# Patient Record
Sex: Female | Born: 1995
Health system: Southern US, Community
[De-identification: ages and names within clinical notes are randomized; demographics above are authoritative.]

## PROBLEM LIST (undated history)

## (undated) ENCOUNTER — Emergency Department (HOSPITAL_COMMUNITY)

## (undated) DIAGNOSIS — B019 Varicella without complication: Secondary | ICD-10-CM

## (undated) DIAGNOSIS — R51 Headache: Secondary | ICD-10-CM

## (undated) DIAGNOSIS — R519 Headache, unspecified: Secondary | ICD-10-CM

## (undated) DIAGNOSIS — T7840XA Allergy, unspecified, initial encounter: Secondary | ICD-10-CM

## (undated) DIAGNOSIS — K219 Gastro-esophageal reflux disease without esophagitis: Secondary | ICD-10-CM

## (undated) HISTORY — DX: Varicella without complication: B01.9

## (undated) HISTORY — DX: Headache, unspecified: R51.9

## (undated) HISTORY — DX: Headache: R51

## (undated) HISTORY — DX: Allergy, unspecified, initial encounter: T78.40XA

## (undated) HISTORY — DX: Gastro-esophageal reflux disease without esophagitis: K21.9

---
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
## 2013-01-08 ENCOUNTER — Emergency Department (HOSPITAL_BASED_OUTPATIENT_CLINIC_OR_DEPARTMENT_OTHER)
Admission: EM | Admit: 2013-01-08 | Discharge: 2013-01-08 | Disposition: A | Discharge status: Home / Self Care | Payer: 59 | Attending: Emergency Medicine | Admitting: Emergency Medicine

## 2013-01-08 ENCOUNTER — Encounter (HOSPITAL_BASED_OUTPATIENT_CLINIC_OR_DEPARTMENT_OTHER): Payer: Self-pay

## 2013-01-08 DIAGNOSIS — Y92410 Unspecified street and highway as the place of occurrence of the external cause: Secondary | ICD-10-CM | POA: Insufficient documentation

## 2013-01-08 DIAGNOSIS — Z8619 Personal history of other infectious and parasitic diseases: Secondary | ICD-10-CM | POA: Insufficient documentation

## 2013-01-08 DIAGNOSIS — S0990XA Unspecified injury of head, initial encounter: Secondary | ICD-10-CM | POA: Insufficient documentation

## 2013-01-08 DIAGNOSIS — Y9389 Activity, other specified: Secondary | ICD-10-CM | POA: Insufficient documentation

## 2013-01-08 MED ORDER — OXYCODONE-ACETAMINOPHEN 5-325 MG PO TABS: 2.0000 | ORAL_TABLET | ORAL | Status: DC | PRN

## 2013-01-08 MED ORDER — IBUPROFEN 600 MG PO TABS: 600.0000 mg | ORAL_TABLET | Freq: Four times a day (QID) | ORAL | Status: DC | PRN
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
## 2013-02-18 ENCOUNTER — Ambulatory Visit: Payer: 59 | Admitting: Family Medicine

## 2014-01-08 ENCOUNTER — Other Ambulatory Visit: Payer: Self-pay | Admitting: Obstetrics & Gynecology

## 2014-01-08 DIAGNOSIS — D242 Benign neoplasm of left breast: Secondary | ICD-10-CM

## 2014-01-16 ENCOUNTER — Other Ambulatory Visit: Payer: 59

## 2014-01-23 ENCOUNTER — Ambulatory Visit
Admission: RE | Admit: 2014-01-23 | Discharge: 2014-01-23 | Disposition: A | Discharge status: Home / Self Care | Payer: 59 | Source: Ambulatory Visit | Attending: Obstetrics & Gynecology | Admitting: Obstetrics & Gynecology

## 2014-01-23 DIAGNOSIS — D242 Benign neoplasm of left breast: Secondary | ICD-10-CM

## 2014-06-17 ENCOUNTER — Ambulatory Visit (INDEPENDENT_AMBULATORY_CARE_PROVIDER_SITE_OTHER): Payer: 59 | Admitting: Medical

## 2014-06-17 ENCOUNTER — Encounter: Payer: Self-pay | Admitting: Medical

## 2014-06-17 VITALS — BP 111/74 | HR 72 | Temp 98.3°F | Ht 65.5 in | Wt 133.4 lb

## 2014-06-17 DIAGNOSIS — R51 Headache: Secondary | ICD-10-CM

## 2014-06-17 DIAGNOSIS — J3089 Other allergic rhinitis: Secondary | ICD-10-CM

## 2014-06-17 DIAGNOSIS — J309 Allergic rhinitis, unspecified: Secondary | ICD-10-CM | POA: Insufficient documentation

## 2014-06-17 DIAGNOSIS — G44209 Tension-type headache, unspecified, not intractable: Secondary | ICD-10-CM

## 2014-06-17 DIAGNOSIS — R519 Headache, unspecified: Secondary | ICD-10-CM | POA: Insufficient documentation

## 2014-06-17 MED ORDER — SUMATRIPTAN SUCCINATE 50 MG PO TABS: 50.0000 mg | ORAL_TABLET | ORAL | Status: DC | PRN

## 2014-06-17 MED ORDER — CYCLOBENZAPRINE HCL 5 MG PO TABS: ORAL_TABLET | ORAL | Status: DC

## 2014-06-17 NOTE — Assessment & Plan Note (Signed)
Vs migraine. Pt is new pt and type is not yet clear. See below plan.  Your ha that you have had frequently may be tension vs migraine type(maybe mixture of both).  Continue alleve for mild ha. If you were to get ha with neck tension then recommend using flexeril. However, if you get HA with nausea, vomiting, sound sensitivity(as you sometimes get), or light sensitivity then take imitrex.  I want you to keep a ha log to understand frequency, intensity, features of ha and response to meds.  If your Ha severe and neurologic type signs or symptoms then ED evalution

## 2014-06-17 NOTE — Assessment & Plan Note (Signed)
Spring allergies mostly and meds otc due help a lot.

## 2014-06-17 NOTE — Patient Instructions (Signed)
Your ha that you have had frequently may be tension vs migraine type(maybe mixture of both).  Continue alleve for mild ha. If you were to get ha with neck tension then recommend using flexeril. However, if you get HA with nausea, vomiting, sound sensitivity(as you sometimes get), or light sensitivity then take imitrex.  I want you to keep a ha log to understand frequency, intensity, features of ha and response to meds.  If your Ha severe and neurologic type signs or symptoms then ED evalution  Follow up in 2 wks or as  Needed.
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
## 2014-07-01 ENCOUNTER — Ambulatory Visit: Payer: 59 | Admitting: Family Medicine

## 2014-07-08 ENCOUNTER — Telehealth: Payer: Self-pay | Admitting: Family Medicine

## 2014-07-08 ENCOUNTER — Ambulatory Visit: Payer: 59 | Admitting: Family Medicine

## 2014-07-08 ENCOUNTER — Encounter: Payer: Self-pay | Admitting: Family Medicine

## 2014-07-08 NOTE — Telephone Encounter (Signed)
Pt canceled appointment due to her feeling better. Pt stated she had a massage and since then she has not experienced any head aches.

## 2014-07-08 NOTE — Telephone Encounter (Signed)
Informed patient of this and appointment scheduled °

## 2014-07-08 NOTE — Telephone Encounter (Signed)
This was scheduled 1 week ago and confirmed yesterday.  Pt had time to call and cancel.  Please remind her that we would like at least 24 hr notice when cancelling.    Ok to remove from schedule.  No fee this time but there will be next time

## 2017-01-09 ENCOUNTER — Ambulatory Visit: Payer: 59 | Admitting: Physician Assistant

## 2017-04-25 LAB — OB RESULTS CONSOLE RPR: RPR: NONREACTIVE

## 2017-04-25 LAB — OB RESULTS CONSOLE HIV ANTIBODY (ROUTINE TESTING): HIV: NONREACTIVE

## 2017-04-25 LAB — OB RESULTS CONSOLE RUBELLA ANTIBODY, IGM: RUBELLA: IMMUNE

## 2017-04-25 LAB — OB RESULTS CONSOLE HEPATITIS B SURFACE ANTIGEN: HEP B S AG: NEGATIVE

## 2017-05-22 NOTE — L&D Delivery Note (Signed)
Delivery Note  First Stage: Labor onset: 7/13 @ 0500 Augmentation : pitocin Analgesia Lorriane Shire intrapartum: epidural SROM at 7/14 @ 1200  Second Stage: Complete dilation at 7/15 @ 2200 Onset of pushing at 2200 FHR second stage category 2  Delivery of a viable female at 2351 by CNM in LOA position no nuchal cord Cord double clamped after cessation of pulsation, cut by FOB Cord blood sample collected   Arterial cord blood sample collected / ph 7.16  Third Stage: Placenta delivered [facility] intact with 3 VC @ 2355 Placenta disposition: hospital disposal Uterine tone firm / bleeding small  no laceration identified  Est. Blood Loss (mL): 015  Complications: prolonged labor / presumed chorioamnionitis with maternal temp 101.5 at time of delivery - Amp & Gent given prior to delivery / compounded by some mild dehydration  Mom to postpartum.  Baby to Couplet care / Skin to Skin.  Newborn: Birth Weight: 7 pounds 5.3 oz Apgar Scores: 1-minute:                           5-minute:   Feeding planned: breastfeeding  Artelia Laroche CNM, MSN, [facility] 12/04/2017, 12:08 AM

## 2017-10-25 ENCOUNTER — Telehealth: Payer: Self-pay | Admitting: Emergency Medicine

## 2017-10-25 NOTE — Telephone Encounter (Signed)
Please advise 

## 2017-10-25 NOTE — Telephone Encounter (Signed)
I am happy to take her back as a pt.  CONGRATS on the pregnancy!  I will be out of the office July 22-August 5(ish) but otherwise I would be happy to see the baby!

## 2017-10-25 NOTE — Telephone Encounter (Signed)
Copied from [place] [phone]. Topic: General - Other >> Oct 25, 2017  3:55 PM Carolyn Stare wrote:  Person calling to ask if Dr Birdie Riddle will except her as a new pt. She had req to become a new pt some years ago but has some no shows and cancellations. She is having a baby that is due in late July and would like for Dr Birdie Riddle to to be the new borns doctor.   [phone]

## 2017-10-26 NOTE — Telephone Encounter (Signed)
LMOVM for pt to call back and schedule New Patient appt, ok per provider.

## 2017-11-15 LAB — OB RESULTS CONSOLE GBS: GBS: NEGATIVE

## 2017-11-15 LAB — OB RESULTS CONSOLE GC/CHLAMYDIA
Chlamydia: NEGATIVE
Gonorrhea: NEGATIVE

## 2017-11-15 NOTE — Telephone Encounter (Signed)
Patient scheduled with Dr. Birdie Riddle for 12/03/2017 at 2:15pm.

## 2017-12-02 ENCOUNTER — Other Ambulatory Visit: Payer: Self-pay

## 2017-12-02 ENCOUNTER — Inpatient Hospital Stay (HOSPITAL_COMMUNITY)
Admission: AD | Admit: 2017-12-02 | Discharge: 2017-12-02 | Disposition: A | Discharge status: Home / Self Care | Payer: 59 | Source: Ambulatory Visit

## 2017-12-02 ENCOUNTER — Encounter (HOSPITAL_COMMUNITY): Payer: Self-pay

## 2017-12-02 DIAGNOSIS — Z3A38 38 weeks gestation of pregnancy: Secondary | ICD-10-CM | POA: Insufficient documentation

## 2017-12-02 MED ORDER — LACTATED RINGERS IV SOLN
Freq: Once | INTRAVENOUS | Status: AC
  Administered 2017-12-02: 05:00:00 via INTRAVENOUS

## 2017-12-02 MED ORDER — MORPHINE SULFATE (PF) 4 MG/ML IV SOLN
4.0000 mg | Freq: Once | INTRAVENOUS | Status: AC
Start: 2017-12-02 — End: 2017-12-02
  Administered 2017-12-02: 4 mg via INTRAMUSCULAR
  Filled 2017-12-02: qty 1

## 2017-12-02 MED ORDER — PROMETHAZINE HCL 25 MG/ML IJ SOLN
25.0000 mg | Freq: Once | INTRAMUSCULAR | Status: AC
  Administered 2017-12-02: 25 mg via INTRAMUSCULAR
  Filled 2017-12-02: qty 1

## 2017-12-02 MED ORDER — MORPHINE SULFATE (PF) 4 MG/ML IV SOLN
4.0000 mg | Freq: Once | INTRAVENOUS | Status: AC
  Administered 2017-12-02: 4 mg via INTRAVENOUS
  Filled 2017-12-02: qty 1

## 2017-12-02 NOTE — MAU Provider Note (Signed)
Brenda Freeman is a 22 y.o. G1P0 at [redacted]w[redacted]d presenting for labor check.  Pt notes painful contractions for past 24 hour. Good fetal movement, minimal pink/red dc with wiping, no leaking fluid.  Desires waterbirth and using [place] for labor coping. [name] spouse is present and supportive.  Feels exhausted, has been up since last night and cannot rest, has tried hydrotherapy at home with minimal relief.  Prenatal Course Source of Care:[name]  with onset of care in 1st trim Pregnancy complications or risk: excessive weight gain 44 lbs Current medications:  PNV's  OB History    Gravida  1   Para      Term      Preterm      AB      Living        SAB      TAB      Ectopic      Multiple      Live Births             Past Medical History:  Diagnosis Date  . Allergy   . Chicken pox   . Frequent headaches    History reviewed. No pertinent surgical history. Family History: family history is not on file. Social History:  reports that she has never smoked. She has never used smokeless tobacco. She reports that she drinks alcohol. She reports that she does not use drugs.  Review of Systems - Negative except painful ctx  Blood pressure 127/80, pulse 94, temperature 98.4 F (36.9 C), temperature source Oral, resp. rate 18, height 5\' 4"  (1.626 m), weight 75.3 kg (166 lb 0.1 oz).  Physical Exam:  General: NAD, teary w/ ctx Heart: RRR, no murmurs Lungs: CTA b/l  Abd: Soft, NT, EFW 6.5-7 lbs  Ext: no edema Neuro: DTRs normal  Membranes:intact Vaginal bleeding:none  Digital Cervical Exam: Dilatation 2-3   Effacement 90  Station -1  Presentation vertex  FHR:  Baseline rate 140   Variability moderate  Accelerations present  Decelerations small variables occasional Contractions: Frequency irregular 1-5  Duration 30-80  Intensity mild  Pertinent Labs/Studies:  Rh pos, PNLabs grossly wnl, GBS neg  Prenatal labs: ABO, Rh:  O pos Antibody:  neg Rubella:   imm RPR:   NR HBsAg:   NR HIV:   Neg GBS:   Neg 1 hr Glucola 91  Genetic screening negative Ultrasound: Weeks 18 Result norm anatomy, 28 wks sono EFW 67%, AFI wnl, anatomy completed  Assessment: 22 y.o. G1P0 at [redacted]w[redacted]d  1. Labor: latent, protracted 2. Fetal Wellbeing: Category 1  3. Pain Control: maternal fatigue, desires help 4. GBS: neg   Plan:  1. Options for continued expectant management, therapeutic rest reviewed Patient desires IV pain meds and hydration with possible DC home if pain relief adequate.  Morphine 4mg  IM and 4 Mg IV. Phenergan 25 mg IM. LR 1L bolus now.   May DC home if [facility] cat 1 and patient stable.  Active labor precautions    Juliene Pina, CNM, MSN 12/02/2017, 4:37 AM

## 2017-12-02 NOTE — MAU Note (Signed)
Urine sent to lab 

## 2017-12-02 NOTE — MAU Note (Signed)
I have communicated with Eddie Dibbles, CNM and reviewed vital signs:  Vitals:   12/02/17 0409 12/02/17 0622  BP: 127/80 125/77  Pulse: 94 94  Resp: 18 18  Temp: 98.4 F (36.9 C)     Vaginal exam:  Dilation: 3 Effacement (%): 90 Station: -1 Presentation: Vertex Exam by:: [employer], RN,   Also reviewed contraction pattern and that non-stress test is reactive.  It has been documented that patient is contracting every 5-7 minutes with no cervical change over 1 hour not indicating active labor.  Patient denies any other complaints.  Based on this report provider has given order for discharge.  A discharge order and diagnosis entered by a provider.   Labor discharge instructions reviewed with patient.

## 2017-12-02 NOTE — Discharge Instructions (Signed)
Braxton Hicks Contractions °Contractions of the uterus can occur throughout pregnancy, but they are not always a sign that you are in labor. You may have practice contractions called Braxton Hicks contractions. These false labor contractions are sometimes confused with true labor. °What are Braxton Hicks contractions? °Braxton Hicks contractions are tightening movements that occur in the muscles of the uterus before labor. Unlike true labor contractions, these contractions do not result in opening (dilation) and thinning of the cervix. Toward the end of pregnancy (32-34 weeks), Braxton Hicks contractions can happen more often and may become stronger. These contractions are sometimes difficult to tell apart from true labor because they can be very uncomfortable. You should not feel embarrassed if you go to the hospital with false labor. °Sometimes, the only way to tell if you are in true labor is for your health care provider to look for changes in the cervix. The health care provider will do a physical exam and may monitor your contractions. If you are not in true labor, the exam should show that your cervix is not dilating and your water has not broken. °If there are other health problems associated with your pregnancy, it is completely safe for you to be sent home with false labor. You may continue to have Braxton Hicks contractions until you go into true labor. °How to tell the difference between true labor and false labor °True labor °· Contractions last 30-70 seconds. °· Contractions become very regular. °· Discomfort is usually felt in the top of the uterus, and it spreads to the lower abdomen and low back. °· Contractions do not go away with walking. °· Contractions usually become more intense and increase in frequency. °· The cervix dilates and gets thinner. °False labor °· Contractions are usually shorter and not as strong as true labor contractions. °· Contractions are usually irregular. °· Contractions  are often felt in the front of the lower abdomen and in the groin. °· Contractions may go away when you walk around or change positions while lying down. °· Contractions get weaker and are shorter-lasting as time goes on. °· The cervix usually does not dilate or become thin. °Follow these instructions at home: °· Take over-the-counter and prescription medicines only as told by your health care provider. °· Keep up with your usual exercises and follow other instructions from your health care provider. °· Eat and drink lightly if you think you are going into labor. °· If Braxton Hicks contractions are making you uncomfortable: °? Change your position from lying down or resting to walking, or change from walking to resting. °? Sit and rest in a tub of warm water. °? Drink enough fluid to keep your urine pale yellow. Dehydration may cause these contractions. °? Do slow and deep breathing several times an hour. °· Keep all follow-up prenatal visits as told by your health care provider. This is important. °Contact a health care provider if: °· You have a fever. °· You have continuous pain in your abdomen. °Get help right away if: °· Your contractions become stronger, more regular, and closer together. °· You have fluid leaking or gushing from your vagina. °· You pass blood-tinged mucus (bloody show). °· You have bleeding from your vagina. °· You have low back pain that you never had before. °· You feel your baby’s head pushing down and causing pelvic pressure. °· Your baby is not moving inside you as much as it used to. °Summary °· Contractions that occur before labor are called Braxton   Hicks contractions, false labor, or practice contractions. °· Braxton Hicks contractions are usually shorter, weaker, farther apart, and less regular than true labor contractions. True labor contractions usually become progressively stronger and regular and they become more frequent. °· Manage discomfort from Braxton Hicks contractions by  changing position, resting in a warm bath, drinking plenty of water, or practicing deep breathing. °This information is not intended to replace advice given to you by your health care provider. Make sure you discuss any questions you have with your health care provider. °Document Released: 09/21/2016 Document Revised: 09/21/2016 Document Reviewed: 09/21/2016 °Elsevier Interactive Patient Education © 2018 [employer] ° °

## 2017-12-02 NOTE — MAU Note (Signed)
Pt. States ctx are 5 min apart and that she was told to come in by midwife. Denies leaking. States bleeding when she wipes.

## 2017-12-03 ENCOUNTER — Inpatient Hospital Stay (HOSPITAL_COMMUNITY): Payer: 59 | Admitting: Anesthesiology

## 2017-12-03 ENCOUNTER — Encounter (HOSPITAL_COMMUNITY): Payer: Self-pay | Admitting: *Deleted

## 2017-12-03 ENCOUNTER — Other Ambulatory Visit: Payer: Self-pay

## 2017-12-03 ENCOUNTER — Ambulatory Visit: Payer: 59 | Admitting: Family Medicine

## 2017-12-03 ENCOUNTER — Inpatient Hospital Stay (HOSPITAL_COMMUNITY)
Admission: AD | Admit: 2017-12-03 | Discharge: 2017-12-05 | DRG: 805 | Disposition: A | Discharge status: Home / Self Care | Payer: 59 | Source: Ambulatory Visit | Attending: Certified Nurse Midwife | Admitting: Certified Nurse Midwife

## 2017-12-03 DIAGNOSIS — O411230 Chorioamnionitis, third trimester, not applicable or unspecified: Secondary | ICD-10-CM | POA: Diagnosis present

## 2017-12-03 DIAGNOSIS — O411290 Chorioamnionitis, unspecified trimester, not applicable or unspecified: Secondary | ICD-10-CM | POA: Diagnosis not present

## 2017-12-03 DIAGNOSIS — E860 Dehydration: Secondary | ICD-10-CM | POA: Diagnosis present

## 2017-12-03 DIAGNOSIS — O9962 Diseases of the digestive system complicating childbirth: Secondary | ICD-10-CM | POA: Diagnosis present

## 2017-12-03 DIAGNOSIS — K219 Gastro-esophageal reflux disease without esophagitis: Secondary | ICD-10-CM | POA: Diagnosis present

## 2017-12-03 DIAGNOSIS — O99284 Endocrine, nutritional and metabolic diseases complicating childbirth: Secondary | ICD-10-CM | POA: Diagnosis present

## 2017-12-03 DIAGNOSIS — O2603 Excessive weight gain in pregnancy, third trimester: Secondary | ICD-10-CM | POA: Diagnosis present

## 2017-12-03 DIAGNOSIS — O9902 Anemia complicating childbirth: Secondary | ICD-10-CM | POA: Diagnosis present

## 2017-12-03 DIAGNOSIS — D509 Iron deficiency anemia, unspecified: Secondary | ICD-10-CM | POA: Diagnosis present

## 2017-12-03 DIAGNOSIS — Z3A38 38 weeks gestation of pregnancy: Secondary | ICD-10-CM | POA: Diagnosis not present

## 2017-12-03 LAB — TYPE AND SCREEN
ABO/RH(D): O POS
ANTIBODY SCREEN: NEGATIVE

## 2017-12-03 LAB — CBC
HCT: 31.4 % — ABNORMAL LOW (ref 36.0–46.0)
Hemoglobin: 10.3 g/dL — ABNORMAL LOW (ref 12.0–15.0)
MCH: 25.8 pg — ABNORMAL LOW (ref 26.0–34.0)
MCHC: 32.8 g/dL (ref 30.0–36.0)
MCV: 78.7 fL (ref 78.0–100.0)
Platelets: 247 10*3/uL (ref 150–400)
RBC: 3.99 MIL/uL (ref 3.87–5.11)
RDW: 14.5 % (ref 11.5–15.5)
WBC: 13.5 10*3/uL — AB (ref 4.0–10.5)

## 2017-12-03 LAB — RPR: RPR: NONREACTIVE

## 2017-12-03 LAB — POCT FERN TEST: POCT Fern Test: POSITIVE

## 2017-12-03 LAB — ABO/RH: ABO/RH(D): O POS

## 2017-12-03 MED ORDER — SOD CITRATE-CITRIC ACID 500-334 MG/5ML PO SOLN
30.0000 mL | ORAL | Status: DC | PRN
  Administered 2017-12-03: 30 mL via ORAL
  Filled 2017-12-03: qty 15

## 2017-12-03 MED ORDER — OXYTOCIN BOLUS FROM INFUSION
500.0000 mL | Freq: Once | INTRAVENOUS | Status: AC
  Administered 2017-12-03: 500 mL via INTRAVENOUS

## 2017-12-03 MED ORDER — PHENYLEPHRINE 40 MCG/ML (10ML) SYRINGE FOR IV PUSH (FOR BLOOD PRESSURE SUPPORT)
80.0000 ug | PREFILLED_SYRINGE | INTRAVENOUS | Status: DC | PRN
  Filled 2017-12-03: qty 5

## 2017-12-03 MED ORDER — PHENYLEPHRINE 40 MCG/ML (10ML) SYRINGE FOR IV PUSH (FOR BLOOD PRESSURE SUPPORT)
80.0000 ug | PREFILLED_SYRINGE | INTRAVENOUS | Status: DC | PRN
  Filled 2017-12-03: qty 10
  Filled 2017-12-03: qty 5

## 2017-12-03 MED ORDER — EPHEDRINE 5 MG/ML INJ
10.0000 mg | INTRAVENOUS | Status: DC | PRN
  Filled 2017-12-03: qty 2

## 2017-12-03 MED ORDER — ONDANSETRON HCL 4 MG/2ML IJ SOLN: 4.0000 mg | Freq: Four times a day (QID) | INTRAMUSCULAR | Status: DC | PRN

## 2017-12-03 MED ORDER — GENTAMICIN SULFATE 40 MG/ML IJ SOLN
160.0000 mg | Freq: Three times a day (TID) | INTRAVENOUS | Status: AC
  Administered 2017-12-04 – 2017-12-05 (×4): 160 mg via INTRAVENOUS
  Filled 2017-12-03 (×4): qty 4

## 2017-12-03 MED ORDER — SODIUM CHLORIDE 0.9 % IV SOLN
2.0000 g | Freq: Four times a day (QID) | INTRAVENOUS | Status: DC
  Administered 2017-12-03 – 2017-12-04 (×2): 2 g via INTRAVENOUS
  Filled 2017-12-03: qty 2000
  Filled 2017-12-03 (×2): qty 2

## 2017-12-03 MED ORDER — FLEET ENEMA 7-19 GM/118ML RE ENEM: 1.0000 | ENEMA | RECTAL | Status: DC | PRN

## 2017-12-03 MED ORDER — PROMETHAZINE HCL 25 MG/ML IJ SOLN
12.5000 mg | Freq: Once | INTRAMUSCULAR | Status: AC
  Administered 2017-12-03: 12.5 mg via INTRAVENOUS
  Filled 2017-12-03: qty 1

## 2017-12-03 MED ORDER — OXYCODONE-ACETAMINOPHEN 5-325 MG PO TABS: 1.0000 | ORAL_TABLET | ORAL | Status: DC | PRN

## 2017-12-03 MED ORDER — LACTATED RINGERS IV SOLN
INTRAVENOUS | Status: DC
  Administered 2017-12-03 (×3): via INTRAVENOUS

## 2017-12-03 MED ORDER — OXYCODONE-ACETAMINOPHEN 5-325 MG PO TABS: 2.0000 | ORAL_TABLET | ORAL | Status: DC | PRN

## 2017-12-03 MED ORDER — OXYTOCIN 40 UNITS IN LACTATED RINGERS INFUSION - SIMPLE MED
1.0000 m[IU]/min | INTRAVENOUS | Status: DC
  Administered 2017-12-03: 2 m[IU]/min via INTRAVENOUS
  Filled 2017-12-03: qty 1000

## 2017-12-03 MED ORDER — FENTANYL 2.5 MCG/ML BUPIVACAINE 1/10 % EPIDURAL INFUSION (WH - ANES)
14.0000 mL/h | INTRAMUSCULAR | Status: DC | PRN
  Administered 2017-12-03 (×3): 14 mL/h via EPIDURAL
  Filled 2017-12-03 (×3): qty 100

## 2017-12-03 MED ORDER — TERBUTALINE SULFATE 1 MG/ML IJ SOLN
0.2500 mg | Freq: Once | INTRAMUSCULAR | Status: DC | PRN
  Filled 2017-12-03: qty 1

## 2017-12-03 MED ORDER — LIDOCAINE HCL (PF) 1 % IJ SOLN
INTRAMUSCULAR | Status: DC | PRN
  Administered 2017-12-03: 8 mL via EPIDURAL

## 2017-12-03 MED ORDER — DIPHENHYDRAMINE HCL 50 MG/ML IJ SOLN: 12.5000 mg | INTRAMUSCULAR | Status: DC | PRN

## 2017-12-03 MED ORDER — LIDOCAINE HCL (PF) 1 % IJ SOLN
30.0000 mL | INTRAMUSCULAR | Status: DC | PRN
  Filled 2017-12-03: qty 30

## 2017-12-03 MED ORDER — LACTATED RINGERS IV SOLN
500.0000 mL | Freq: Once | INTRAVENOUS | Status: AC
  Administered 2017-12-03: 500 mL via INTRAVENOUS

## 2017-12-03 MED ORDER — FENTANYL CITRATE (PF) 100 MCG/2ML IJ SOLN
50.0000 ug | Freq: Once | INTRAMUSCULAR | Status: AC
  Administered 2017-12-03: 50 ug via INTRAVENOUS
  Filled 2017-12-03: qty 2

## 2017-12-03 MED ORDER — OXYTOCIN 40 UNITS IN LACTATED RINGERS INFUSION - SIMPLE MED: 2.5000 [IU]/h | INTRAVENOUS | Status: DC

## 2017-12-03 MED ORDER — LACTATED RINGERS IV SOLN
500.0000 mL | INTRAVENOUS | Status: DC | PRN
  Administered 2017-12-03: 300 mL via INTRAVENOUS
  Administered 2017-12-03 (×2): 500 mL via INTRAVENOUS
  Administered 2017-12-03: 1000 mL via INTRAVENOUS

## 2017-12-03 MED ORDER — ACETAMINOPHEN 325 MG PO TABS
650.0000 mg | ORAL_TABLET | ORAL | Status: DC | PRN
  Administered 2017-12-03: 650 mg via ORAL
  Filled 2017-12-03: qty 2

## 2017-12-03 NOTE — Anesthesia Pain Management Evaluation Note (Signed)
  CRNA Pain Management Visit Note  Patient: Brenda Freeman, 22 y.o., female  "Hello I am a member of the anesthesia team at [facility]. We have an anesthesia team available at all times to provide care throughout the hospital, including epidural management and anesthesia for C-section. I don't know your plan for the delivery whether it a natural birth, water birth, IV sedation, nitrous supplementation, doula or epidural, but we want to meet your pain goals."   1.Was your pain managed to your expectations on prior hospitalizations?   No prior hospitalizations  2.What is your expectation for pain management during this hospitalization?     Epidural  3.How can we help you reach that goal? support  Record the patient's initial score and the patient's pain goal.   Pain: 2  Pain Goal: 4 [facility] wants you to be able to say your pain was always managed very well.  Sandrea Matte 12/03/2017

## 2017-12-03 NOTE — H&P (Signed)
OB ADMISSION/ HISTORY & PHYSICAL:  Admission Date: 12/03/2017  3:36 AM  Admit Diagnosis: 23 WEEKS CTX, SROM  Brenda Freeman is a 22 y.o. female presenting for latent labor x 48 hrs and now reporting LOF since last evening at 1800. Reports clear AF. Small amount bloody show.  Was seen in MAU yesterday and received therapeutic rest with Morphine and Phenergan which helped her sleep for several hours. Now reporting ctx very painful and has not been able to sleep this night.  + FM, NO HA/RUQ pain or visual changes. + Nausea. Spouse [name] and doula [name] here and giving labor support.  Patient desires epidural for pain relief, but due to unit acuity is waiting for a room in L&D.   Prenatal History: G1P0   EDC : 12/12/2017,  Source of Care:[name]  with onset of care in 1st trim Pregnancy complications or risk: excessive weight gain 44 lbs Current medications:   PNV's  Prenatal labs: ABO, Rh:  O pos Antibody:  neg Rubella:  imm RPR:   NR HBsAg:   NR HIV:   Neg GBS:   Neg 1 hr Glucola 91            Genetic screening negative Ultrasound: Weeks 18 Result norm anatomy, 28 wks sono EFW 67%, AFI wnl, anatomy completed   Medical / Surgical History :  Past medical history:  Past Medical History:  Diagnosis Date  . Allergy   . Chicken pox   . Frequent headaches      Past surgical history: History reviewed. No pertinent surgical history.   Family History: No family history on file.   Social History:  reports that she has never smoked. She has never used smokeless tobacco. She reports that she drinks alcohol. She reports that she does not use drugs.   Allergies: Patient has no known allergies.   Current Medications at time of admission:  Medications Prior to Admission  Medication Sig Dispense Refill Last Dose  . acetaminophen (TYLENOL) 325 MG suppository Place 325 mg rectally every 4 (four) hours as needed.   12/02/2017 at Unknown time  . cyclobenzaprine (FLEXERIL) 5 MG  tablet 1 tab po tid prn neck pain 10 tablet 0 More than a month at Unknown time  . ibuprofen (ADVIL,MOTRIN) 600 MG tablet Take 1 tablet (600 mg total) by mouth every 6 (six) hours as needed for pain. 30 tablet 0 More than a month at Unknown time  . Prenatal Vit-Fe Fumarate-FA (MULTIVITAMIN-PRENATAL) 27-0.8 MG TABS tablet Take 1 tablet by mouth daily at 12 noon.   Past Week at Unknown time  . SUMAtriptan (IMITREX) 50 MG tablet Take 1 tablet (50 mg total) by mouth every 2 (two) hours as needed for migraine or headache. May repeat in 2 hours if headache persists or recurs. 10 tablet 0 More than a month at Unknown time     Review of Systems: ROS As noted above  Physical Exam: Vital signs and nursing notes reviewed.  ED Triage Vitals  Enc Vitals Group     BP 12/03/17 0414 122/74     Pulse Rate 12/03/17 0414 99     Resp 12/03/17 0414 18     Temp 12/03/17 0414 98.4 F (36.9 C)     Temp src --      SpO2 12/03/17 0414 99 %     Weight 12/03/17 0358 166 lb (75.3 kg)    General: AAO x 3, NAD, coping w/ ctx but very tired Heart: RRR Lungs:CTAB Abdomen:  Gravid, NT, Leopold's vertex, EFW 6.5-7 lbs Extremities: no edema Genitalia / VE: Dilation: 3.5 Effacement (%): 90 Cervical Position: Middle Station: -2 Presentation: Vertex Exam by:: B Mosca RN   FHR:  145 BPM, min/mod variability, + accels, occasional early decels TOCO: Ctx q2-6 min, irregular, palp mild  Labs:   Pending T&S, RPR  Recent Labs    12/03/17 0521  WBC 13.5*  HGB 10.3*  HCT 31.4*  PLT 247     Assessment:  22 y.o. G1P0 at [redacted]w[redacted]d  1. Prolonged latent stage of labor 2. FHR category 1 3. GBS neg 4. Desires epidural 5. Breastfeeding 6. Placenta disposal - to patient per request  Plan:  1. Admit to BS 2. Routine L&D orders 3. Fentanyl/Phenergan IV while in MAU and awaiting L&D room, then epidural 4. Pitocin augmentation 5. Anticipate NSVB   Dr Ronita Hipps notified of admission / plan of care   Kay[place], MSN 12/03/2017, 6:58 AM

## 2017-12-03 NOTE — Progress Notes (Signed)
S:  No pain or pressure - rested some / changing positions with doula every 30 minutes  O:  VS: BP 111/62   Pulse 94   Temp (P) 99 F (37.2 C) (Axillary)   Resp 18   Ht 5\' 4"  (1.626 m)   Wt 75.3 kg (166 lb)   SpO2 99%   BMI 28.49 kg/m         FHR : baseline 170 / variability decreased / accelerations absent / occasional variable decelerations        Toco: contractions every 2-5 minutes / pitocin 12 mu         cervix : 9cm / 95% / vtx 0 with caput direct OP        membranes: clear fluid / + show        foley dark amber urine ~ 300 in collection bag  A: prolonged labor     FHR category 2  P: mild dehydration - IVF bolus and PO intake increased with ice water     Tylenol PO      repositioned with assistance to hands-knees - supported bilaterally with dense epidural      Spinning babies maneuvers to rotate OP      repositioned to far lateral with peanut - increase pitocin every 30 minutes for adequate CTX       recheck 1 hour     Artelia Laroche CNM, MSN, [facility] 12/03/2017, 7PM -  8:15 PM

## 2017-12-03 NOTE — Anesthesia Preprocedure Evaluation (Signed)
Anesthesia Evaluation  Patient identified by MRN, date of birth, ID band Patient awake    Reviewed: Allergy & Precautions, H&P , NPO status , Patient's Chart, lab work & pertinent test results, reviewed documented beta blocker date and time   Airway Mallampati: I  TM Distance: >3 FB Neck ROM: full    Dental no notable dental hx. (+) Teeth Intact   Pulmonary neg pulmonary ROS,    Pulmonary exam normal breath sounds clear to auscultation       Cardiovascular negative cardio ROS Normal cardiovascular exam Rhythm:regular Rate:Normal     Neuro/Psych negative neurological ROS  negative psych ROS   GI/Hepatic negative GI ROS, Neg liver ROS,   Endo/Other  negative endocrine ROS  Renal/GU negative Renal ROS  negative genitourinary   Musculoskeletal   Abdominal   Peds  Hematology negative hematology ROS (+)   Anesthesia Other Findings   Reproductive/Obstetrics (+) Pregnancy                             Anesthesia Physical Anesthesia Plan  ASA: II  Anesthesia Plan: Epidural   Post-op Pain Management:    Induction:   PONV Risk Score and Plan:   Airway Management Planned:   Additional Equipment:   Intra-op Plan:   Post-operative Plan:   Informed Consent: I have reviewed the patients History and Physical, chart, labs and discussed the procedure including the risks, benefits and alternatives for the proposed anesthesia with the patient or authorized representative who has indicated his/her understanding and acceptance.     Plan Discussed with:   Anesthesia Plan Comments:         Anesthesia Quick Evaluation

## 2017-12-03 NOTE — MAU Note (Signed)
Pt reports contractions since Friday. More painful for the last couple of hours. Pt reports mucous discharge

## 2017-12-03 NOTE — Progress Notes (Signed)
S:  Pressure and pain with epidural  O:  VS: BP 119/73   Pulse 98   Temp 97.9 F (36.6 C) (Oral)   Resp 18   Ht 5\' 4"  (1.626 m)   Wt 75.3 kg (166 lb)   SpO2 99%   BMI 28.49 kg/m         FHR : baseline 145 / variability moderate / accelerations + / no decelerations        Toco: contractions every 3-4 minutes / mild / pitocin 8 mu        Cervix : 6cm / 90% / vtx 0        Membranes: bloody show  A: prolonged labor     FHR category 1  P: increase pitocin - inadequate ctx      peanut positioning fetal rotation and descent     Artelia Laroche CNM, MSN, [facility] 12/03/2017, 3:43 PM

## 2017-12-03 NOTE — Progress Notes (Signed)
S:  comfortable with epidural  O:  VS: BP 125/75   Pulse 90   Temp 97.9 F (36.6 C) (Oral)   Resp 20   Ht 5\' 4"  (1.626 m)   Wt 75.3 kg (166 lb)   SpO2 99%   BMI 28.49 kg/m         FHR : baseline 140 / variability moderate / accelerations + / no decelerations        Toco: contractions every 3-4 minutes / pitocin @ 4 mu         Cervix : 5 / 90 / vtx 0         Membranes: ROM - forewaters broke  A: active labor     FHR category 1  P:  pitocin augmentation        Anticipate SVB         Reposition on peanut for fetal rotation   Artelia Laroche CNM, MSN, [facility] 12/03/2017, 12:18 PM

## 2017-12-03 NOTE — Anesthesia Procedure Notes (Signed)
Epidural Patient location during procedure: OB Start time: 12/03/2017 8:07 AM End time: 12/03/2017 8:19 AM  Staffing Anesthesiologist: Janeece Riggers, MD  Preanesthetic Checklist Completed: patient identified, site marked, surgical consent, pre-op evaluation, timeout performed, IV checked, risks and benefits discussed and monitors and equipment checked  Epidural Patient position: sitting Prep: site prepped and draped and DuraPrep Patient monitoring: continuous pulse ox and blood pressure Approach: midline Location: L3-L4 Injection technique: LOR air  Needle:  Needle type: Tuohy  Needle gauge: 17 G Needle length: 9 cm and 9 Needle insertion depth: 6 cm Catheter type: closed end flexible Catheter size: 19 Gauge Catheter at skin depth: 11 cm Test dose: negative  Assessment Events: blood not aspirated, injection not painful, no injection resistance, negative IV test and no paresthesia

## 2017-12-04 ENCOUNTER — Encounter (HOSPITAL_COMMUNITY): Payer: Self-pay

## 2017-12-04 DIAGNOSIS — O411290 Chorioamnionitis, unspecified trimester, not applicable or unspecified: Secondary | ICD-10-CM | POA: Diagnosis not present

## 2017-12-04 LAB — CBC
HCT: 26.1 % — ABNORMAL LOW (ref 36.0–46.0)
Hemoglobin: 8.7 g/dL — ABNORMAL LOW (ref 12.0–15.0)
MCH: 26 pg (ref 26.0–34.0)
MCHC: 33.3 g/dL (ref 30.0–36.0)
MCV: 78.1 fL (ref 78.0–100.0)
Platelets: 207 10*3/uL (ref 150–400)
RBC: 3.34 MIL/uL — ABNORMAL LOW (ref 3.87–5.11)
RDW: 14.6 % (ref 11.5–15.5)
WBC: 26.4 10*3/uL — ABNORMAL HIGH (ref 4.0–10.5)

## 2017-12-04 MED ORDER — IBUPROFEN 600 MG PO TABS
600.0000 mg | ORAL_TABLET | Freq: Four times a day (QID) | ORAL | Status: DC
  Administered 2017-12-04 (×2): 600 mg via ORAL
  Filled 2017-12-04 (×6): qty 1

## 2017-12-04 MED ORDER — PRENATAL MULTIVITAMIN CH
1.0000 | ORAL_TABLET | Freq: Every day | ORAL | Status: DC
  Filled 2017-12-04 (×2): qty 1

## 2017-12-04 MED ORDER — DIBUCAINE 1 % RE OINT: 1.0000 "application " | TOPICAL_OINTMENT | RECTAL | Status: DC | PRN

## 2017-12-04 MED ORDER — SALINE SPRAY 0.65 % NA SOLN
1.0000 | NASAL | Status: DC | PRN
  Filled 2017-12-04: qty 44

## 2017-12-04 MED ORDER — ACETAMINOPHEN 325 MG PO TABS: 650.0000 mg | ORAL_TABLET | ORAL | Status: DC | PRN

## 2017-12-04 MED ORDER — SENNOSIDES-DOCUSATE SODIUM 8.6-50 MG PO TABS
2.0000 | ORAL_TABLET | ORAL | Status: DC
  Filled 2017-12-04: qty 2

## 2017-12-04 MED ORDER — POLYSACCHARIDE IRON COMPLEX 150 MG PO CAPS
150.0000 mg | ORAL_CAPSULE | Freq: Every day | ORAL | Status: DC
  Administered 2017-12-05: 150 mg via ORAL
  Filled 2017-12-04 (×3): qty 1

## 2017-12-04 MED ORDER — MAGNESIUM OXIDE 400 (241.3 MG) MG PO TABS
400.0000 mg | ORAL_TABLET | Freq: Every day | ORAL | Status: DC
  Administered 2017-12-05: 400 mg via ORAL
  Filled 2017-12-04 (×5): qty 1

## 2017-12-04 MED ORDER — COCONUT OIL OIL: 1.0000 "application " | TOPICAL_OIL | Status: DC | PRN

## 2017-12-04 MED ORDER — SODIUM CHLORIDE 0.9 % IV SOLN
2.0000 g | Freq: Four times a day (QID) | INTRAVENOUS | Status: AC
  Administered 2017-12-04 – 2017-12-05 (×3): 2 g via INTRAVENOUS
  Filled 2017-12-04: qty 2000
  Filled 2017-12-04: qty 2
  Filled 2017-12-04: qty 2000

## 2017-12-04 MED ORDER — FAMOTIDINE IN NACL 20-0.9 MG/50ML-% IV SOLN
20.0000 mg | Freq: Two times a day (BID) | INTRAVENOUS | Status: DC
  Administered 2017-12-04 (×2): 20 mg via INTRAVENOUS
  Filled 2017-12-04 (×3): qty 50

## 2017-12-04 MED ORDER — SIMETHICONE 80 MG PO CHEW: 80.0000 mg | CHEWABLE_TABLET | ORAL | Status: DC | PRN

## 2017-12-04 MED ORDER — BENZOCAINE-MENTHOL 20-0.5 % EX AERO
1.0000 "application " | INHALATION_SPRAY | CUTANEOUS | Status: DC | PRN
  Administered 2017-12-04: 1 via TOPICAL
  Filled 2017-12-04: qty 56

## 2017-12-04 MED ORDER — WITCH HAZEL-GLYCERIN EX PADS: 1.0000 "application " | MEDICATED_PAD | CUTANEOUS | Status: DC | PRN

## 2017-12-04 NOTE — Progress Notes (Signed)
PPD 1 SVD- intact / chorioamnionitis  S:  Reports feeling exhausted - slept some since delivery but still really tired / reflux continues with nausea             Tolerating po/ No nausea or vomiting             Bleeding is light             Pain controlled with Motrin             Up ad lib / not ambulatory except to BR / voiding QS  Newborn Breast   O: VS: BP 114/67 (BP Location: Right Arm)   Pulse 94   Temp 99.1 F (37.3 C) (Oral)   Resp 16   Ht 5\' 4"  (1.626 m)   Wt 75.3 kg (166 lb)   SpO2 100%   Breastfeeding? Unknown   BMI 28.49 kg/m    Tmax 101.5 at time of delivery- afebrile since delivery  LABS:             Recent Labs    12/03/17 0521 12/04/17 0631  WBC 13.5* 26.4*  HGB 10.3* 8.7*  PLT 247 207               Blood type: O positive  Rubella: Immune (12/05 0000)                     I&O: Intake/Output      07/15 0701 - 07/16 0700 07/16 0701 - 07/17 0700   IV Piggyback  676.3   Total Intake(mL/kg)  676.3 (9)   Urine (mL/kg/hr) 1150 (0.6)    Blood 100    Total Output 1250    Net -1250 +676.3                   Physical Exam:             Alert and oriented X3  Lungs: Clear and unlabored  Heart: regular rate and rhythm / no mumurs  Abdomen: soft, non-tender, non-distended              Fundus: firm, non-tender, Ueven  Perineum: ice pack in place / intact  Lochia: moderate  Extremities: 1+ pedal edema, no calf pain or tenderness    A: PPD # 1 SVD -intact             IDA of pregnancy             Chorioamnionitis - ABX course Amp & Gent             GERD   P: Routine post partum orders  continue ABX until 24hr afebrile - likely early tomorrow am             Iron and magnesium             Increase water intake / rest today             Pepcid IV x 24 hours for GERD  Artelia Laroche CNM, MSN, [facility] 12/04/2017, 8:39 AM

## 2017-12-04 NOTE — Progress Notes (Addendum)
.   Pharmacy Antibiotic Note  Brenda Freeman is a 22 y.o. female admitted on 12/03/2017 with labor, now with maternal fever.  Pharmacy has been consulted for gentamicin dosing.  Plan: gentamicin 160 mg IV Q8 hours  Height: 5\' 4"  (162.6 cm) Weight: 166 lb (75.3 kg) IBW/kg (Calculated) : 54.7  Temp (24hrs), Avg:99.1 F (37.3 C), Min:97.2 F (36.2 C), Max:101.5 F (38.6 C)  Recent Labs  Lab 12/03/17 0521  WBC 13.5*    CrCl cannot be calculated (No order found.).    No Known Allergies  Antimicrobials this admission: Ampicillin 2 grams Q6  >>  Gentamicin 160 mg Q8   >>    Thank you for allowing pharmacy to be a part of this patient's care.  [name] 12/04/2017 4:02 AM

## 2017-12-04 NOTE — Lactation Note (Signed)
This note was copied from a baby's chart. Lactation Consultation Note Baby 7 hrs old.  1st baby. Mom trying to feed in cradle position. Mom has short shaft compressible nipples. Mom stated she knows how to hand express and she has colostrum.  Baby not very interested in BF. Licks occasionally. Baby gagging. Abd. Distended. Newborn feeding habits, behavior, STS, I&O discussed. Mom encouraged to feed baby 8-12 times/24 hours and with feeding cues. Mom encouraged to waken baby for feeds if hasn't fed in 3 hrs. Encouraged to call for assistance or concerns. [place] brochure given w/resources, support groups and [place] services.  Patient Name: Brenda Freeman [id number] Date: 12/04/2017 Reason for consult: Initial assessment;Early term 37-38.6wks;1st time breastfeeding   Maternal Data Has patient been taught Hand Expression?: Yes Does the patient have breastfeeding experience prior to this delivery?: No  Feeding Length of feed: 0 min  LATCH Score Latch: Repeated attempts needed to sustain latch, nipple held in mouth throughout feeding, stimulation needed to elicit sucking reflex.  Audible Swallowing: None  Type of Nipple: Everted at rest and after stimulation  Comfort (Breast/Nipple): Soft / non-tender  Hold (Positioning): No assistance needed to correctly position infant at breast.  LATCH Score: 7  Interventions Interventions: Breast feeding basics reviewed;Support pillows;Position options;Skin to skin  Lactation Tools Discussed/Used WIC Program: No   Consult Status Consult Status: Follow-up Date: 12/05/17 Follow-up type: In-patient    Theodoro Kalata 12/04/2017, 6:51 AM

## 2017-12-04 NOTE — Anesthesia Postprocedure Evaluation (Signed)
Anesthesia Post Note  Patient: Brenda Freeman  Procedure(s) Performed: AN AD [place]     Patient location during evaluation: Mother Baby Anesthesia Type: Epidural Level of consciousness: awake and alert and oriented Pain management: satisfactory to patient Vital Signs Assessment: post-procedure vital signs reviewed and stable Respiratory status: spontaneous breathing and nonlabored ventilation Cardiovascular status: stable Postop Assessment: no headache, no backache, no signs of nausea or vomiting, adequate PO intake, patient able to bend at knees and able to ambulate (patient up walking) Anesthetic complications: no    Last Vitals:  Vitals:   12/04/17 0210 12/04/17 0315  BP: 128/74 114/62  Pulse: 75 87  Resp: 16 16  Temp: 37.1 C 36.8 C  SpO2: 100% 100%    Last Pain:  Vitals:   12/04/17 0520  TempSrc:   PainSc: 4    Pain Goal: Patients Stated Pain Goal: 10 (12/03/17 0740)               Willa Rough

## 2017-12-05 MED ORDER — MAGNESIUM OXIDE 400 (241.3 MG) MG PO TABS: 400.0000 mg | ORAL_TABLET | Freq: Every day | ORAL | 1 refills | Status: DC

## 2017-12-05 MED ORDER — ACETAMINOPHEN 325 MG PO TABS: 650.0000 mg | ORAL_TABLET | ORAL | 1 refills | Status: DC | PRN

## 2017-12-05 MED ORDER — POLYSACCHARIDE IRON COMPLEX 150 MG PO CAPS: 150.0000 mg | ORAL_CAPSULE | Freq: Every day | ORAL | 1 refills | Status: DC

## 2017-12-05 MED ORDER — IBUPROFEN 600 MG PO TABS: 600.0000 mg | ORAL_TABLET | Freq: Four times a day (QID) | ORAL | 0 refills | Status: DC

## 2017-12-05 NOTE — Discharge Instructions (Signed)
Home Care Instructions for Mom °ACTIVITY °· Gradually return to your regular activities. °· Let yourself rest. Nap while your baby sleeps. °· Avoid lifting anything that is heavier than 10 lb (4.5 kg) until your health care provider says it is okay. °· Avoid activities that take a lot of effort and energy (are strenuous) until approved by your health care provider. Walking at a slow-to-moderate pace is usually safe. °· If you had a cesarean delivery: °? Do not vacuum, climb stairs, or drive a car for 4-6 weeks. °? Have someone help you at home until you feel like you can do your usual activities yourself. °? Do exercises as told by your health care provider, if this applies. ° °VAGINAL BLEEDING °You may continue to bleed for 4-6 weeks after delivery. Over time, the amount of blood usually decreases and the color of the blood usually gets lighter. However, the flow of bright red blood may increase if you have been too active. If you need to use more than one pad in an hour because your pad gets soaked, or if you pass a large clot: °· Lie down. °· Raise your feet. °· Place a cold compress on your lower abdomen. °· Rest. °· Call your health care provider. ° °If you are breastfeeding, your period should return anytime between 8 weeks after delivery and the time that you stop breastfeeding. If you are not breastfeeding, your period should return 6-8 weeks after delivery. °PERINEAL CARE °The perineal area, or perineum, is the part of your body between your thighs. After delivery, this area needs special care. Follow these instructions as told by your health care provider. °· Take warm tub baths for 15-20 minutes. °· Use medicated pads and pain-relieving sprays and creams as told. °· Do not use tampons or douches until vaginal bleeding has stopped. °· Each time you go to the bathroom: °? Use a peri bottle. °? Change your pad. °? Use towelettes in place of toilet paper until your stitches have healed. °· Do Kegel exercises  every day. Kegel exercises help to maintain the muscles that support the vagina, bladder, and bowels. You can do these exercises while you are standing, sitting, or lying down. To do Kegel exercises: °? Tighten the muscles of your abdomen and the muscles that surround your birth canal. °? Hold for a few seconds. °? Relax. °? Repeat until you have done this 5 times in a row. °· To prevent hemorrhoids from developing or getting worse: °? Drink enough fluid to keep your urine clear or pale yellow. °? Avoid straining when having a bowel movement. °? Take over-the-counter medicines and stool softeners as told by your health care provider. ° °BREAST CARE °· Wear a tight-fitting bra. °· Avoid taking over-the-counter pain medicine for breast discomfort. °· Apply ice to the breasts to help with discomfort as needed: °? Put ice in a plastic bag. °? Place a towel between your skin and the bag. °? Leave the ice on for 20 minutes or as told by your health care provider. ° °NUTRITION °· Eat a well-balanced diet. °· Do not try to lose weight quickly by cutting back on calories. °· Take your prenatal vitamins until your postpartum checkup or until your health care provider tells you to stop. ° °POSTPARTUM DEPRESSION °You may find yourself crying for no apparent reason and unable to cope with all of the changes that come with having a newborn. This mood is called postpartum depression. Postpartum depression happens because your hormone   levels change after delivery. If you have postpartum depression, get support from your partner, friends, and family. If the depression does not go away on its own after several weeks, contact your health care provider. BREAST SELF-EXAM Do a breast self-exam each month, at the same time of the month. If you are breastfeeding, check your breasts just after a feeding, when your breasts are less full. If you are breastfeeding and your period has started, check your breasts on day 5, 6, or 7 of your  period. Report any lumps, bumps, or discharge to your health care provider. Know that breasts are normally lumpy if you are breastfeeding. This is temporary, and it is not a health risk. INTIMACY AND SEXUALITY Avoid sexual activity for at least 3-4 weeks after delivery or until the brownish-red vaginal flow is completely gone. If you want to avoid pregnancy, use some form of birth control. You can get pregnant after delivery, even if you have not had your period. SEEK MEDICAL CARE IF:  You feel unable to cope with the changes that a child brings to your life, and these feelings do not go away after several weeks.  You notice a lump, a bump, or discharge on your breast.  SEEK IMMEDIATE MEDICAL CARE IF:  Blood soaks your pad in 1 hour or less.  You have: ? Severe pain or cramping in your lower abdomen. ? A bad-smelling vaginal discharge. ? A fever that is not controlled by medicine. ? A fever, and an area of your breast is red and sore. ? Pain or redness in your calf. ? Sudden, severe chest pain. ? Shortness of breath. ? Painful or bloody urination. ? Problems with your vision.  You vomit for 12 hours or longer.  You develop a severe headache.  You have serious thoughts about hurting yourself, your child, or anyone else.  This information is not intended to replace advice given to you by your health care provider. Make sure you discuss any questions you have with your health care provider. Document Released: 05/05/2000 Document Revised: 10/14/2015 Document Reviewed: 11/09/2014 Elsevier Interactive Patient Education  2017 [place] Tips If you are breastfeeding, there may be times when you cannot feed your baby directly. Returning to work or going on a trip are common examples. Pumping allows you to store breast milk and feed it to your baby later. You may not get much milk when you first start to pump. Your breasts should start to make more after a few days.  If you pump at the times you usually feed your baby, you may be able to keep making enough milk to feed your baby without also using formula. The more often you pump, the more milk you will produce. When should I pump?  You can begin to pump soon after delivery. However, some experts recommend waiting about 4 weeks before giving your infant a bottle to make sure breastfeeding is going well.  If you plan to return to work, begin pumping a few weeks before. This will help you develop techniques that work best for you. It also lets you build up a supply of breast milk.  When you are with your infant, feed on demand and pump after each feeding.  When you are away from your infant for several hours, pump for about 15 minutes every 2-3 hours. Pump both breasts at the same time if you can.  If your infant has a formula feeding, make sure to pump around  the same time.  If you drink any alcohol, wait 2 hours before pumping. How do I prepare to pump? Your let-down reflexis the natural reaction to stimulation that makes your breast milk flow. It is easier to stimulate this reflex when you are relaxed. Find relaxation techniques that work for you. If you have difficulty with your let-down reflex, try these methods:  Smell one of your infant's blankets or an item of clothing.  Look at a picture or video of your infant.  Sit in a quiet, private space.  Massage the breast you plan to pump.  Place soothing warmth on the breast.  Play relaxing music.  What are some general breast pumping tips?  Wash your hands before you pump. You do not need to wash your nipples or breasts.  There are three ways to pump. ? You can use your hand to massage and compress your breast. ? You can use a handheld manual pump. ? You can use an electric pump.  Make sure the suction cup (flange) on the breast pump is the right size. Place the flange directly over the nipple. If it is the wrong size or placed the wrong  way, it may be painful and cause nipple damage.  If pumping is uncomfortable, apply a small amount of purified or modified lanolin to your nipple and areola.  If you are using an electric pump, adjust the speed and suction power to be more comfortable.  If pumping is painful or if you are not getting very much milk, you may need a different type of pump. A lactation consultant can help you determine what type of pump to use.  Keep a full water bottle near you at all times. Drinking lots of fluid helps you make more milk.  You can store your milk to use later. Pumped breast milk can be stored in a sealable, sterile container or plastic bag. Label all stored breast milk with the date you pumped it. ? Milk can stay out at room temperature for up to 8 hours. ? You can store your milk in the refrigerator for up to 8 days. ? You can store your milk in the freezer for 3 months. Thaw frozen milk using warm water. Do not put it in the microwave.  Do not smoke. Smoking can lower your milk supply and harm your infant. If you need help quitting, ask your health care provider to recommend a program. When should I call my health care provider or a lactation consultant?  You are having trouble pumping.  You are concerned that you are not making enough milk.  You have nipple pain, soreness, or redness.  You want to use birth control. Birth control pills may lower your milk supply. Talk to your health care provider about your options. This information is not intended to replace advice given to you by your health care provider. Make sure you discuss any questions you have with your health care provider. Document Released: 10/26/2009 Document Revised: 10/20/2015 Document Reviewed: 02/28/2013 Elsevier Interactive Patient Education  2017 [place].    Postpartum Care After Vaginal Delivery The period of time right after you deliver your newborn is called the postpartum period. What kind of medical  care will I receive?  You may continue to receive fluids and medicines through an IV tube inserted into one of your veins.  If an incision was made near your vagina (episiotomy) or if you had some vaginal tearing during delivery, cold compresses may be placed  on your episiotomy or your tear. This helps to reduce pain and swelling.  You may be given a squirt bottle to use when you go to the bathroom. You may use this until you are comfortable wiping as usual. To use the squirt bottle, follow these steps: ? Before you urinate, fill the squirt bottle with warm water. Do not use hot water. ? After you urinate, while you are sitting on the toilet, use the squirt bottle to rinse the area around your urethra and vaginal opening. This rinses away any urine and blood. ? You may do this instead of wiping. As you start healing, you may use the squirt bottle before wiping yourself. Make sure to wipe gently. ? Fill the squirt bottle with clean water every time you use the bathroom.  You will be given sanitary pads to wear. How can I expect to feel?  You may not feel the need to urinate for several hours after delivery.  You will have some soreness and pain in your abdomen and vagina.  If you are breastfeeding, you may have uterine contractions every time you breastfeed for up to several weeks postpartum. Uterine contractions help your uterus return to its normal size.  It is normal to have vaginal bleeding (lochia) after delivery. The amount and appearance of lochia is often similar to a menstrual period in the first week after delivery. It will gradually decrease over the next few weeks to a dry, yellow-brown discharge. For most women, lochia stops completely by 6-8 weeks after delivery. Vaginal bleeding can vary from woman to woman.  Within the first few days after delivery, you may have breast engorgement. This is when your breasts feel heavy, full, and uncomfortable. Your breasts may also throb and  feel hard, tightly stretched, warm, and tender. After this occurs, you may have milk leaking from your breasts.Your health care provider can help you relieve discomfort due to breast engorgement. Breast engorgement should go away within a few days.  You may feel more sad or worried than normal due to hormonal changes after delivery. These feelings should not last more than a few days. If these feelings do not go away after several days, speak with your health care provider. How should I care for myself?  Tell your health care provider if you have pain or discomfort.  Drink enough water to keep your urine clear or pale yellow.  Wash your hands thoroughly with soap and water for at least 20 seconds after changing your sanitary pads, after using the toilet, and before holding or feeding your baby.  If you are not breastfeeding, avoid touching your breasts a lot. Doing this can make your breasts produce more milk.  If you become weak or lightheaded, or you feel like you might faint, ask for help before: ? Getting out of bed. ? Showering.  Change your sanitary pads frequently. Watch for any changes in your flow, such as a sudden increase in volume, a change in color, the passing of large blood clots. If you pass a blood clot from your vagina, save it to show to your health care provider. Do not flush blood clots down the toilet without having your health care provider look at them.  Make sure that all your vaccinations are up to date. This can help protect you and your baby from getting certain diseases. You may need to have immunizations done before you leave the hospital.  If desired, talk with your health care provider about methods of  family planning or birth control (contraception). How can I start bonding with my baby? Spending as much time as possible with your baby is very important. During this time, you and your baby can get to know each other and develop a bond. Having your baby stay  with you in your room (rooming in) can give you time to get to know your baby. Rooming in can also help you become comfortable caring for your baby. Breastfeeding can also help you bond with your baby. How can I plan for returning home with my baby?  Make sure that you have a car seat installed in your vehicle. ? Your car seat should be checked by a certified car seat installer to make sure that it is installed safely. ? Make sure that your baby fits into the car seat safely.  Ask your health care provider any questions you have about caring for yourself or your baby. Make sure that you are able to contact your health care provider with any questions after leaving the hospital. This information is not intended to replace advice given to you by your health care provider. Make sure you discuss any questions you have with your health care provider. Document Released: 03/05/2007 Document Revised: 10/11/2015 Document Reviewed: 04/12/2015 Elsevier Interactive Patient Education  2018 [employer].  Postpartum Depression and Baby Blues The postpartum period begins right after the birth of a baby. During this time, there is often a great amount of [place] and excitement. It is also a time of many changes in the life of the parents. Regardless of how many times a mother gives birth, each child brings new challenges and dynamics to the family. It is not unusual to have feelings of excitement along with confusing shifts in moods, emotions, and thoughts. All mothers are at risk of developing postpartum depression or the "baby blues." These mood changes can occur right after giving birth, or they may occur many months after giving birth. The baby blues or postpartum depression can be mild or severe. Additionally, postpartum depression can go away rather quickly, or it can be a long-term condition. What are the causes? Raised hormone levels and the rapid drop in those levels are thought to be a main cause of postpartum  depression and the baby blues. A number of hormones change during and after pregnancy. Estrogen and progesterone usually decrease right after the delivery of your baby. The levels of thyroid hormone and various cortisol steroids also rapidly drop. Other factors that play a role in these mood changes include major life events and genetics. What increases the risk? If you have any of the following risks for the baby blues or postpartum depression, know what symptoms to watch out for during the postpartum period. Risk factors that may increase the likelihood of getting the baby blues or postpartum depression include:  Having a personal or family history of depression.  Having depression while being pregnant.  Having premenstrual mood issues or mood issues related to oral contraceptives.  Having a lot of life stress.  Having marital conflict.  Lacking a social support network.  Having a baby with special needs.  Having health problems, such as diabetes.  What are the signs or symptoms? Symptoms of baby blues include:  Brief changes in mood, such as going from extreme happiness to sadness.  Decreased concentration.  Difficulty sleeping.  Crying spells, tearfulness.  Irritability.  Anxiety.  Symptoms of postpartum depression typically begin within the first month after giving birth. These symptoms include:  Difficulty sleeping or excessive sleepiness.  Marked weight loss.  Agitation.  Feelings of worthlessness.  Lack of interest in activity or food.  Postpartum psychosis is a very serious condition and can be dangerous. Fortunately, it is rare. Displaying any of the following symptoms is cause for immediate medical attention. Symptoms of postpartum psychosis include:  Hallucinations and delusions.  Bizarre or disorganized behavior.  Confusion or disorientation.  How is this diagnosed? A diagnosis is made by an evaluation of your symptoms. There are no medical or lab  tests that lead to a diagnosis, but there are various questionnaires that a health care provider may use to identify those with the baby blues, postpartum depression, or psychosis. Often, a screening tool called the [place] Postnatal Depression Scale is used to diagnose depression in the postpartum period. How is this treated? The baby blues usually goes away on its own in 1-2 weeks. Social support is often all that is needed. You will be encouraged to get adequate sleep and rest. Occasionally, you may be given medicines to help you sleep. Postpartum depression requires treatment because it can last several months or longer if it is not treated. Treatment may include individual or group therapy, medicine, or both to address any social, physiological, and psychological factors that may play a role in the depression. Regular exercise, a healthy diet, rest, and social support may also be strongly recommended. Postpartum psychosis is more serious and needs treatment right away. Hospitalization is often needed. Follow these instructions at home:  Get as much rest as you can. Nap when the baby sleeps.  Exercise regularly. Some women find yoga and walking to be beneficial.  Eat a balanced and nourishing diet.  Do little things that you enjoy. Have a cup of tea, take a bubble bath, read your favorite magazine, or listen to your favorite music.  Avoid alcohol.  Ask for help with household chores, cooking, grocery shopping, or running errands as needed. Do not try to do everything.  Talk to people close to you about how you are feeling. Get support from your partner, family members, friends, or other new moms.  Try to stay positive in how you think. Think about the things you are grateful for.  Do not spend a lot of time alone.  Only take over-the-counter or prescription medicine as directed by your health care provider.  Keep all your postpartum appointments.  Let your health care provider know  if you have any concerns. Contact a health care provider if: You are having a reaction to or problems with your medicine. Get help right away if:  You have suicidal feelings.  You think you may harm the baby or someone else. This information is not intended to replace advice given to you by your health care provider. Make sure you discuss any questions you have with your health care provider. Document Released: 02/10/2004 Document Revised: 10/14/2015 Document Reviewed: 02/17/2013 Elsevier Interactive Patient Education  2017 [employer].

## 2017-12-05 NOTE — Discharge Summary (Signed)
  Obstetric Discharge Summary  Patient ID: Brenda Freeman MRN: [id number] DOB/AGE: May 24, 1995 22 y.o.   Date of Admission: 12/03/2017  Date of Discharge:  12/05/17  Admitting Diagnosis: Onset of Labor at [redacted]w[redacted]d  Secondary Diagnosis: Anemia in pregnancy, Chorioamnionitis  Mode of Delivery: normal spontaneous vaginal delivery     Discharge Diagnosis: No other diagnosis   Intrapartum Procedures: pitocin augmentation   Post partum procedures: None  Complications: None   Brief Hospital Course   Brenda Freeman is a G1P1001 who had a SVD on 12/03/2017;  for further details of this birth, please refer to the delivey note.  Patient had an uncomplicated postpartum course.  By time of discharge on PPD#2, her pain was controlled on oral pain medications; she had appropriate lochia and was ambulating, voiding without difficulty and tolerating regular diet.  She was deemed stable for discharge to home.    Labs: CBC Latest Ref Rng & Units 12/04/2017 12/03/2017  WBC 4.0 - 10.5 K/uL 26.4(H) 13.5(H)  Hemoglobin 12.0 - 15.0 g/dL 8.7(L) 10.3(L)  Hematocrit 36.0 - 46.0 % 26.1(L) 31.4(L)  Platelets 150 - 400 K/uL 803 212   Conflict (See Lab Report): O POS/O POS Performed at [facility], [address], [place], [place] [address]   Physical exam:   Temp:  [97.6 F (36.4 C)-98.6 F (37 C)] 98.6 F (37 C) (07/17 0630) Pulse Rate:  [94-97] 94 (07/16 2105) Resp:  [18] 18 (07/17 0630) BP: (100-113)/(62-78) 113/78 (07/17 0630) SpO2:  [97 %-100 %] 100 % (07/17 0630)  General: alert and no distress  Lochia: appropriate  Abdomen: soft, NT  Uterine Fundus: firm  Perineum: no dehiscence, no significant erythema  Extremities: No evidence of DVT seen on physical exam.   Discharge Instructions: Per After Visit Summary.  Activity: Advance as tolerated. Pelvic rest for 6 weeks.  Also refer to After Visit Summary  Diet: Regular  Medications:  Allergies as of 12/05/2017   No Known Allergies      Medication List    TAKE these medications   acetaminophen 325 MG tablet Commonly known as:  TYLENOL Take 2 tablets (650 mg total) by mouth every 4 (four) hours as needed (for pain scale < 4).   ibuprofen 600 MG tablet Commonly known as:  ADVIL,MOTRIN Take 1 tablet (600 mg total) by mouth every 6 (six) hours.   iron polysaccharides 150 MG capsule Commonly known as:  NIFEREX Take 1 capsule (150 mg total) by mouth daily.   magnesium oxide 400 (241.3 Mg) MG tablet Commonly known as:  MAG-OX Take 1 tablet (400 mg total) by mouth daily.   multivitamin-prenatal 27-0.8 MG Tabs tablet Take 1 tablet by mouth daily at 12 noon.      Outpatient follow up:  Follow-up Information    Artelia Laroche, CNM Follow up.   Specialty:  Obstetrics and Gynecology Why:  The office will call you to schedule a six (6) week postpartum appointment Contact information: [place] [address] [phone]          Discharged Condition: stable  Discharged to: home   Newborn Data:  Disposition:home with mother  Apgars: APGAR (1 MIN): 8   APGAR (5 MINS): 9    Baby Feeding: Breast   Diona Fanti, [facility] 12/05/17 12:05 PM

## 2017-12-05 NOTE — Progress Notes (Signed)
Pt requested to have IV discontinued.  Pt educated that she still has IV medication during the day today.  Pt still request that the IV be removed.

## 2017-12-05 NOTE — Lactation Note (Signed)
This note was copied from a baby's chart. Lactation Consultation Note  Patient Name: Brenda Freeman [id number]'F Date: 12/05/2017 Reason for consult: Follow-up assessment;Difficult latch  P1 mother whose infant is now 35 hours old.  Mother is requesting latch assistance stating that baby does not latch deep enough.  Assisted baby to latch in the football hold on the right breast without difficulty.  Mother has soft breast tissue and erect nipples.  Demonstrated breast compressions and how to get baby to open wide for a deeper latch.  Mother stated no pain with latch and baby with flanged lips and audible swallows.    Encouraged feeding 8-12 times/24 hours or more if she shows feeding cues.  Continue STS while awake and breast massage and hand expression after feedings.  Colostrum container provided and mother will feed back any EBM she may obtain with hand expression.  Discussed colostrum storage times.  Mother will call for assistance as needed.   Maternal Data Formula Feeding for Exclusion: No Has patient been taught Hand Expression?: Yes Does the patient have breastfeeding experience prior to this delivery?: No  Feeding Feeding Type: Breast Fed Length of feed: 10 min(still feeding when I left)  LATCH Score Latch: Grasps breast easily, tongue down, lips flanged, rhythmical sucking.  Audible Swallowing: Spontaneous and intermittent  Type of Nipple: Everted at rest and after stimulation  Comfort (Breast/Nipple): Soft / non-tender  Hold (Positioning): Assistance needed to correctly position infant at breast and maintain latch.  LATCH Score: 9  Interventions Interventions: Breast feeding basics reviewed;Assisted with latch;Skin to skin;Breast massage;Hand express;Position options;Support pillows;Adjust position;Breast compression  Lactation Tools Discussed/Used     Consult Status Consult Status: Follow-up Date: 12/05/17 Follow-up type: In-patient    Shilo Philipson R  Anjelique Makar 12/05/2017, 12:05 AM

## 2017-12-05 NOTE — Lactation Note (Signed)
This note was copied from a baby's chart. Lactation Consultation Note: assist mother with latching infant on in cross cradle hold. Infant sustained latch for 20 mins. Observed frequent suckling and swallows. Mother taught to do breast compression. Observed audible swallows.  Mother was advised to continue to breastfeed infant on cue and at least 8-12 times in 24 hours.  Discussed cluster feeding. Mother was given a harmony hand pump with instructions as needed.  Discussed treatment and prevention of engorgement.  Mother was receptive to all breastfeeding teaching. Father at the bedside and very supportive.  Mother is aware of available [place] services at [facility] and in the community .   Patient Name: Brenda Freeman [id number] Date: 12/05/2017 Reason for consult: Follow-up assessment   Maternal Data    Feeding Feeding Type: Breast Fed Length of feed: 20 min  LATCH Score Latch: Grasps breast easily, tongue down, lips flanged, rhythmical sucking.  Audible Swallowing: Spontaneous and intermittent  Type of Nipple: Everted at rest and after stimulation  Comfort (Breast/Nipple): Filling, red/small blisters or bruises, mild/mod discomfort  Hold (Positioning): Assistance needed to correctly position infant at breast and maintain latch.  LATCH Score: 8  Interventions Interventions: Assisted with latch;Hand express;Breast compression;Adjust position;Support pillows;Position options;Expressed milk;DEBP  Lactation Tools Discussed/Used     Consult Status Consult Status: Complete    Darla Lesches 12/05/2017, 1:25 PM

## 2017-12-06 ENCOUNTER — Ambulatory Visit: Payer: Self-pay

## 2017-12-06 NOTE — Lactation Note (Signed)
This note was copied from a baby's chart. Lactation Consultation Note  Patient Name: Brenda Freeman Date: 12/06/2017 Reason for consult: Follow-up assessment Baby is 53 hours old and at a 4% weight loss.  Mom states baby is feeding well.  She is asking about when to pump.  Recommended she wait 3 weeks before any routine pumping.  Okay to pump for comfort if she becomes engorged.  Lactation outpatient services and support reviewed and encouraged prn.  Maternal Data    Feeding Feeding Type: Breast Fed Length of feed: 20 min  LATCH Score                   Interventions    Lactation Tools Discussed/Used     Consult Status Consult Status: Complete Follow-up type: Call as needed    Ave Filter 12/06/2017, 10:26 AM

## 2018-01-09 ENCOUNTER — Other Ambulatory Visit: Payer: Self-pay

## 2018-01-09 ENCOUNTER — Encounter: Payer: Self-pay | Admitting: Family Medicine

## 2018-01-09 ENCOUNTER — Ambulatory Visit (INDEPENDENT_AMBULATORY_CARE_PROVIDER_SITE_OTHER): Payer: 59 | Admitting: Family Medicine

## 2018-01-09 VITALS — BP 110/68 | HR 85 | Temp 98.4°F | Resp 16 | Ht 64.5 in | Wt 142.0 lb

## 2018-01-09 DIAGNOSIS — Z0000 Encounter for general adult medical examination without abnormal findings: Secondary | ICD-10-CM

## 2018-01-09 DIAGNOSIS — D509 Iron deficiency anemia, unspecified: Secondary | ICD-10-CM | POA: Diagnosis not present

## 2018-01-09 LAB — CBC WITH DIFFERENTIAL/PLATELET
BASOS PCT: 0.5 % (ref 0.0–3.0)
Basophils Absolute: 0 10*3/uL (ref 0.0–0.1)
EOS ABS: 0.1 10*3/uL (ref 0.0–0.7)
Eosinophils Relative: 3.6 % (ref 0.0–5.0)
HCT: 39.9 % (ref 36.0–46.0)
Hemoglobin: 12.9 g/dL (ref 12.0–15.0)
Lymphocytes Relative: 40.8 % (ref 12.0–46.0)
Lymphs Abs: 1.4 10*3/uL (ref 0.7–4.0)
MCHC: 32.5 g/dL (ref 30.0–36.0)
MCV: 77.6 fl — ABNORMAL LOW (ref 78.0–100.0)
MONO ABS: 0.3 10*3/uL (ref 0.1–1.0)
Monocytes Relative: 7.3 % (ref 3.0–12.0)
NEUTROS ABS: 1.7 10*3/uL (ref 1.4–7.7)
Neutrophils Relative %: 47.8 % (ref 43.0–77.0)
PLATELETS: 257 10*3/uL (ref 150.0–400.0)
RBC: 5.14 Mil/uL — ABNORMAL HIGH (ref 3.87–5.11)
RDW: 15.2 % (ref 11.5–15.5)
WBC: 3.5 10*3/uL — ABNORMAL LOW (ref 4.0–10.5)

## 2018-01-09 LAB — LIPID PANEL
CHOLESTEROL: 183 mg/dL (ref 0–200)
HDL: 73.6 mg/dL (ref >39.00)
LDL CALC: 93 mg/dL (ref 0–99)
NonHDL: 109.62
TRIGLYCERIDES: 81 mg/dL (ref 0.0–149.0)
Total CHOL/HDL Ratio: 2
VLDL: 16.2 mg/dL (ref 0.0–40.0)

## 2018-01-09 LAB — HEPATIC FUNCTION PANEL
ALT: 17 U/L (ref 0–35)
AST: 18 U/L (ref 0–37)
Albumin: 4.4 g/dL (ref 3.5–5.2)
Alkaline Phosphatase: 89 U/L (ref 39–117)
BILIRUBIN DIRECT: 0.1 mg/dL (ref 0.0–0.3)
BILIRUBIN TOTAL: 0.6 mg/dL (ref 0.2–1.2)
Total Protein: 7.5 g/dL (ref 6.0–8.3)

## 2018-01-09 LAB — BASIC METABOLIC PANEL
BUN: 13 mg/dL (ref 6–23)
CALCIUM: 9.8 mg/dL (ref 8.4–10.5)
CHLORIDE: 105 meq/L (ref 96–112)
CO2: 26 mEq/L (ref 19–32)
CREATININE: 1.03 mg/dL (ref 0.40–1.20)
GFR: 84.44 mL/min (ref >60.00)
Glucose, Bld: 76 mg/dL (ref 70–99)
Potassium: 4.3 mEq/L (ref 3.5–5.1)
Sodium: 140 mEq/L (ref 135–145)

## 2018-01-09 LAB — TSH: TSH: 1.9 u[IU]/mL (ref 0.35–4.50)

## 2018-01-09 NOTE — Patient Instructions (Signed)
Follow up in 1 year or as needed We'll notify you of your lab results and make any changes if needed Think about the flu shot!  We can schedule a nurse visit at any time (it's to protect you AND baby!) Call with any questions or concerns CONGRATS!!!

## 2018-01-09 NOTE — Progress Notes (Signed)
   Subjective:    Patient ID: Brenda Freeman, female    DOB: [birth year], 22 y.o.   MRN: [id number]  HPI Pt to re-establish care.  No concerns today.  4 weeks post-partum.  Breast feeding.     Review of Systems Patient reports no vision/ hearing changes, adenopathy,fever, weight change,  persistant/recurrent hoarseness , swallowing issues, chest pain, palpitations, edema, persistant/recurrent cough, hemoptysis, dyspnea (rest/exertional/paroxysmal nocturnal), gastrointestinal bleeding (melena, rectal bleeding), abdominal pain, significant heartburn, bowel changes, GU symptoms (dysuria, hematuria, incontinence), Gyn symptoms (abnormal  bleeding, pain),  syncope, focal weakness, memory loss, numbness & tingling, skin/hair/nail changes, abnormal bruising or bleeding, anxiety, or depression.     Objective:   Physical Exam General Appearance:    Alert, cooperative, no distress, appears stated age  Head:    Normocephalic, without obvious abnormality, atraumatic  Eyes:    PERRL, conjunctiva/corneas clear, EOM's intact, fundi    benign, both eyes  Ears:    Normal TM's and external ear canals, both ears  Nose:   Nares normal, septum midline, mucosa normal, no drainage    or sinus tenderness  Throat:   Lips, mucosa, and tongue normal; teeth and gums normal  Neck:   Supple, symmetrical, trachea midline, no adenopathy;    Thyroid: no enlargement/tenderness/nodules  Back:     Symmetric, no curvature, ROM normal, no CVA tenderness  Lungs:     Clear to auscultation bilaterally, respirations unlabored  Chest Wall:    No tenderness or deformity   Heart:    Regular rate and rhythm, S1 and S2 normal, no murmur, rub   or gallop  Breast Exam:    Deferred to GYN  Abdomen:     Soft, non-tender, bowel sounds active all four quadrants,    no masses, no organomegaly  Genitalia:    Deferred to GYN  Rectal:    Extremities:   Extremities normal, atraumatic, no cyanosis or edema  Pulses:   2+ and symmetric all  extremities  Skin:   Skin color, texture, turgor normal, no rashes or lesions  Lymph nodes:   Cervical, supraclavicular, and axillary nodes normal  Neurologic:   CNII-XII intact, normal strength, sensation and reflexes    throughout          Assessment & Plan:

## 2018-01-09 NOTE — Assessment & Plan Note (Signed)
Pt's PE WNL.  UTD on Tdap.  Discussed flu to protect both pt and new baby.  Check labs given recent anemia from delivery.  Anticipatory guidance provided.

## 2018-03-24 ENCOUNTER — Emergency Department (HOSPITAL_COMMUNITY): Payer: 59

## 2018-03-24 ENCOUNTER — Encounter (HOSPITAL_COMMUNITY): Payer: Self-pay

## 2018-03-24 ENCOUNTER — Emergency Department (HOSPITAL_COMMUNITY)
Admission: EM | Admit: 2018-03-24 | Discharge: 2018-03-24 | Disposition: A | Discharge status: Home / Self Care | Payer: 59 | Attending: Emergency Medicine | Admitting: Emergency Medicine

## 2018-03-24 DIAGNOSIS — S0990XA Unspecified injury of head, initial encounter: Secondary | ICD-10-CM | POA: Diagnosis present

## 2018-03-24 DIAGNOSIS — Y999 Unspecified external cause status: Secondary | ICD-10-CM | POA: Insufficient documentation

## 2018-03-24 DIAGNOSIS — Y9389 Activity, other specified: Secondary | ICD-10-CM | POA: Insufficient documentation

## 2018-03-24 DIAGNOSIS — Y92410 Unspecified street and highway as the place of occurrence of the external cause: Secondary | ICD-10-CM | POA: Diagnosis not present

## 2018-03-24 MED ORDER — HYDROCODONE-ACETAMINOPHEN 5-325 MG PO TABS
1.0000 | ORAL_TABLET | Freq: Once | ORAL | Status: DC
  Filled 2018-03-24: qty 1

## 2018-03-24 MED ORDER — ACETAMINOPHEN 500 MG PO TABS
1000.0000 mg | ORAL_TABLET | Freq: Once | ORAL | Status: DC
  Filled 2018-03-24: qty 2
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
## 2018-03-24 NOTE — Discharge Instructions (Addendum)
Home to rest.  Take Motrin and Tylenol as needed as directed for pain. Sure to drink plenty of fluids. Recheck with your primary care provider, return to ER for significant or concerning symptoms.
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
## 2018-03-26 ENCOUNTER — Ambulatory Visit: Payer: Self-pay

## 2018-03-26 ENCOUNTER — Other Ambulatory Visit: Payer: Self-pay

## 2018-03-26 ENCOUNTER — Encounter: Payer: Self-pay | Admitting: Family Medicine

## 2018-03-26 ENCOUNTER — Ambulatory Visit (INDEPENDENT_AMBULATORY_CARE_PROVIDER_SITE_OTHER): Payer: 59 | Admitting: Family Medicine

## 2018-03-26 DIAGNOSIS — M545 Low back pain, unspecified: Secondary | ICD-10-CM

## 2018-03-26 DIAGNOSIS — R51 Headache: Secondary | ICD-10-CM | POA: Diagnosis not present

## 2018-03-26 DIAGNOSIS — M542 Cervicalgia: Secondary | ICD-10-CM

## 2018-03-26 DIAGNOSIS — R519 Headache, unspecified: Secondary | ICD-10-CM

## 2018-03-26 MED ORDER — TRAMADOL HCL 50 MG PO TABS: 50.0000 mg | ORAL_TABLET | Freq: Three times a day (TID) | ORAL | 0 refills | Status: DC | PRN
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
## 2018-03-26 NOTE — Patient Instructions (Signed)
Please follow up if symptoms do not improve or as needed.   Start taking 600mg  of ibuprofen every 6-8 hours as needed and add tylenol ES 1-2 tabs in between if needed.  For severe pain, (if develops), you may try the ultram medication as directed, however, please pump and dump your breast mild for 6-8 hours afterwards to avoid transfer of medication to your daughter.   Heating pads and rest will also help you recover.
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
## 2018-03-27 ENCOUNTER — Telehealth: Payer: Self-pay | Admitting: Emergency Medicine

## 2018-03-27 ENCOUNTER — Encounter: Payer: Self-pay | Admitting: Emergency Medicine

## 2018-03-27 NOTE — Telephone Encounter (Signed)
Patient informed of letter at front desk   Doloris Hall,  LPN

## 2018-03-27 NOTE — Telephone Encounter (Signed)
Copied from [place] [id number]. Topic: General - Other >> Mar 27, 2018  9:51 AM Sheran Luz wrote: Reason for CRM: Patient states that when leaving office yesterday, she forgot to pick up a paper excusing her from work. Patient would like to know if she can pick that up today. Please advise.

## 2018-03-29 ENCOUNTER — Telehealth: Payer: Self-pay | Admitting: *Deleted

## 2018-03-29 DIAGNOSIS — M542 Cervicalgia: Secondary | ICD-10-CM

## 2018-03-29 DIAGNOSIS — M545 Low back pain, unspecified: Secondary | ICD-10-CM
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
## 2018-03-29 NOTE — Addendum Note (Signed)
Addended by: Katina Dung on: 03/29/2018 09:00 AM   Modules accepted: Orders

## 2018-03-29 NOTE — Telephone Encounter (Signed)
Referral has been placed. 

## 2018-03-29 NOTE — Telephone Encounter (Signed)
Copied from [place] [id number]. Topic: General - Other >> Mar 29, 2018  8:40 AM Keene Breath wrote: Reason for CRM: Patient called to speak with the nurse or doctor regarding her pain she is still having and wanted to know if it is possible to receive a referral for PT to relieve the pain instead of getting a shot.  Please advise.  CB# [phone]

## 2018-04-10 ENCOUNTER — Ambulatory Visit: Payer: 59 | Attending: Family Medicine | Admitting: Rehabilitative and Restorative Service Providers"

## 2018-04-10 ENCOUNTER — Encounter: Payer: Self-pay | Admitting: Rehabilitative and Restorative Service Providers"

## 2018-04-10 DIAGNOSIS — R29898 Other symptoms and signs involving the musculoskeletal system: Secondary | ICD-10-CM | POA: Diagnosis present

## 2018-04-10 DIAGNOSIS — M546 Pain in thoracic spine: Secondary | ICD-10-CM

## 2018-04-10 DIAGNOSIS — R293 Abnormal posture: Secondary | ICD-10-CM

## 2018-04-10 DIAGNOSIS — M545 Low back pain, unspecified: Secondary | ICD-10-CM

## 2018-04-10 DIAGNOSIS — M542 Cervicalgia: Secondary | ICD-10-CM | POA: Diagnosis not present

## 2018-04-10 NOTE — Patient Instructions (Addendum)
Work on standing and sitting with good posture  Axial Extension (Chin Tuck)    Pull chin in and lengthen back of neck. Hold __5__ seconds while counting out loud. Repeat __10__ times. Do __several__ sessions per day.  Shoulder Blade Squeeze    Rotate shoulders back, then squeeze shoulder blades down and back - lift chest  Hold 10 sec Repeat _10___ times. Do __2-3__ sessions per day.  Upper Back Strength: Lower Trapezius / Rotator Cuff " L's "     Arms in waitress pose, palms up. Press hands back and slide shoulder blades down. Hold for __5__ seconds. Repeat _10___ times. 1-2 times per day.    Scapular Retraction: Elbow Flexion (Standing)  "W's"     With elbows bent to 90, pinch shoulder blades together and rotate arms out, keeping elbows bent. Repeat __10__ times per set. Do __1-2__ sets per session. Do _several ___ sessions per day.  Scapula Adduction With Pectoralis Stretch: Low - Standing   Shoulders at 45 hands even with shoulders, keeping weight through legs, shift weight forward until you feel pull or stretch through the front of your chest. Hold _30__ seconds. Do _3__ times, _2-4__ times per day.   Scapula Adduction With Pectoralis Stretch: Mid-Range - Standing   Shoulders at 90 elbows even with shoulders, keeping weight through legs, shift weight forward until you feel pull or strength through the front of your chest. Hold __30_ seconds. Do _3__ times, __2-4_ times per day.   Scapula Adduction With Pectoralis Stretch: High - Standing   Shoulders at 120 hands up high on the doorway, keeping weight on feet, shift weight forward until you feel pull or stretch through the front of your chest. Hold _30__ seconds. Do _3__ times, _2-3__ times per day.   Cat / Cow Flow    Inhale, press spine toward ceiling like a Halloween cat. Keeping strength in arms and abdominals, exhale to soften spine through neutral and into cow pose. Open chest and arch back.  Initiate movement between cat and cow at tailbone, one vertebrae at a time. Repeat _3-4___ times.    BACK: Child's Pose (Sciatica)    Sit in knee-chest position and reach arms forward. Separate knees for comfort. Hold position for _20 sec. Repeat _3__ times. Do _1-2__ times per day.   TENS UNIT: This is helpful for muscle pain and spasm.   Search and Purchase a TENS 7000 2nd edition at www.tenspros.com. It should be less than $30.     TENS unit instructions: Do not shower or bathe with the unit on Turn the unit off before removing electrodes or batteries If the electrodes lose stickiness add a drop of water to the electrodes after they are disconnected from the unit and place on plastic sheet. If you continued to have difficulty, call the TENS unit company to purchase more electrodes. Do not apply lotion on the skin area prior to use. Make sure the skin is clean and dry as this will help prolong the life of the electrodes. After use, always check skin for unusual red areas, rash or other skin difficulties. If there are any skin problems, does not apply electrodes to the same area. Never remove the electrodes from the unit by pulling the wires. Do not use the TENS unit or electrodes other than as directed. Do not change electrode placement without consultating your therapist or physician. Keep 2 fingers with between each electrode.         Marland Kitchen
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
## 2018-04-23 ENCOUNTER — Encounter: Payer: Self-pay | Admitting: Physical Therapy

## 2018-04-23 ENCOUNTER — Ambulatory Visit: Payer: 59 | Attending: Family Medicine | Admitting: Physical Therapy

## 2018-04-23 DIAGNOSIS — M542 Cervicalgia: Secondary | ICD-10-CM | POA: Insufficient documentation

## 2018-04-23 DIAGNOSIS — M546 Pain in thoracic spine: Secondary | ICD-10-CM | POA: Diagnosis present

## 2018-04-23 DIAGNOSIS — M545 Low back pain, unspecified: Secondary | ICD-10-CM

## 2018-04-23 DIAGNOSIS — R293 Abnormal posture: Secondary | ICD-10-CM | POA: Diagnosis present

## 2018-04-23 DIAGNOSIS — R29898 Other symptoms and signs involving the musculoskeletal system: Secondary | ICD-10-CM | POA: Diagnosis present

## 2018-04-23 NOTE — Patient Instructions (Signed)
Resisted Horizontal Abduction: Bilateral    Sit or stand, tubing in both hands, arms out in front. Keeping arms straight, pinch shoulder blades together and stretch arms out. Repeat ___10_ times per set. Do __1-2__ sets per session. Do ___1-2_ sessions per day.  http://orth.exer.us/969   Copyright  VHI. All rights reserved.   Resisted External Rotation: in Neutral - Bilateral    Sit or stand, tubing in both hands, elbows at sides, bent to 90, forearms forward. Pinch shoulder blades together and rotate forearms out. Keep elbows at sides. Repeat __10__ times per set. Do __1-2__ sets per session. Do __1-2__ sessions per day.  http://orth.exer.us/967   Copyright  VHI. All rights reserved.

## 2018-04-23 NOTE — Therapy (Signed)
[place], [place], [address] Phone: [phone]   Fax:  [phone]  Physical Therapy Treatment  Patient Details  Name: Special Ranes MRN: [id number] Date of Birth: 17-Feb-1996 Referring Provider (PT): Dr Birdie Riddle   Encounter Date: 04/23/2018  PT End of Session - 04/23/18 1157    Visit Number  2    Number of Visits  12    Date for PT Re-Evaluation  05/23/18    PT Start Time  6962    PT Stop Time  1247    PT Time Calculation (min)  62 min    Activity Tolerance  Patient tolerated treatment well    Behavior During Therapy  [facility] for tasks assessed/performed       Past Medical History:  Diagnosis Date  . Allergy   . Chicken pox   . Frequent headaches     History reviewed. No pertinent surgical history.  There were no vitals filed for this visit.  Subjective Assessment - 04/23/18 1148    Subjective  Last week had radiating pain in her L neck.  Today is just uncomfortable.  Hard to get comfortable at night , neck /back . Doing exercises.     Currently in Pain?  Yes    Pain Score  1    discomfort    Pain Location  Neck    Pain Orientation  Posterior    Pain Descriptors / Indicators  Discomfort    Pain Type  Acute pain    Pain Onset  1 to 4 weeks ago    Pain Frequency  Intermittent    Pain Relieving Factors  nothing, pain meds     Pain Score  0    Pain Location  Back    Pain Orientation  Lower    Pain Descriptors / Indicators  Discomfort    Pain Type  Acute pain    Pain Onset  1 to 4 weeks ago    Pain Frequency  Intermittent           [facility] Adult PT Treatment/Exercise - 04/23/18 0001      Neck Exercises: Machines for Strengthening   UBE (Upper Arm Bike)  5 min L1       Neck Exercises: Seated   Neck Retraction  10 reps    Postural Training  scapular retraction x 10       Shoulder Exercises: Supine   Horizontal ABduction  Strengthening;Both;10 reps;Theraband    Theraband Level (Shoulder  Horizontal ABduction)  Level 2 (Red)    External Rotation  Strengthening;Both;20 reps;Theraband    Theraband Level (Shoulder External Rotation)  Level 2 (Red)    Flexion  Strengthening;Both;10 reps    Theraband Level (Shoulder Flexion)  Level 2 (Red)      Shoulder Exercises: Seated   External Rotation  Strengthening;Both;12 reps    Theraband Level (Shoulder External Rotation)  Level 1 (Yellow)      Shoulder Exercises: ROM/Strengthening   Other ROM/Strengthening Exercises  corner stretching 3 positions       Moist Heat Therapy   Number Minutes Moist Heat  15 Minutes    Moist Heat Location  Cervical;Lumbar Spine      Electrical Stimulation   Electrical Stimulation Location  bilat lower cervical and thoracic     Electrical Stimulation Action  IFC     Electrical Stimulation Parameters  8.5     Electrical Stimulation Goals  Tone;Pain  PT Education - 04/23/18 1214    Education Details  cervical alignment , HEP technique     Person(s) Educated  Patient    Methods  Explanation;Demonstration    Comprehension  Verbalized understanding;Returned demonstration          PT Long Term Goals - 04/10/18 1305      PT LONG TERM GOAL #1   Title  Decrease pain to 0/10 to 2/10 at most with all functional activities and at rest 05/23/18    Time  6    Period  Weeks    Status  New      PT LONG TERM GOAL #2   Title  Increased cervical ROM by 5-10 deg in all planes 05/23/18    Time  6    Period  Weeks    Status  New      PT LONG TERM GOAL #3   Title  Patient demonstrates improved posture and alignment with posterior shoulder girdle engaged 05/23/18    Time  6    Period  Weeks    Status  New      PT LONG TERM GOAL #4   Title  Independent in HEP 05/23/18    Time  6    Period  Weeks    Status  New      PT LONG TERM GOAL #5   Title  Improve FOTO to </= 23% limitation 05/23/18    Time  6    Period  Weeks    Status  New            Plan - 04/23/18 1256    Clinical  Impression Statement  Patient did well today, able to do exercises with min correction.  Added in bands for more postural awareness/feedback.  Focused on neck today.  Prefers a female therapist so worked to make changes to her schedule.      PT Treatment/Interventions  Patient/family education;ADLs/Self Care Home Management;Cryotherapy;Electrical Stimulation;Iontophoresis 4mg /ml Dexamethasone;Moist Heat;Ultrasound;Dry needling;Manual techniques;Neuromuscular re-education;Therapeutic activities;Therapeutic exercise    PT Next Visit Plan  progress with core stabilization/strengthening; stretching; manual work and modalities as indicated. Dry needling to cervicals     PT Home Exercise Plan  doorway/corner stretch, chin tuck, scap squeeze, horizontal abd red and ER red     Consulted and Agree with Plan of Care  Patient       Patient will benefit from skilled therapeutic intervention in order to improve the following deficits and impairments:  Postural dysfunction, Improper body mechanics, Pain, Increased fascial restricitons, Increased muscle spasms, Decreased mobility, Decreased range of motion, Decreased activity tolerance  Visit Diagnosis: Cervicalgia  Pain in thoracic spine  Acute bilateral low back pain without sciatica  Other symptoms and signs involving the musculoskeletal system  Abnormal posture     Problem List Patient Active Problem List   Diagnosis Date Noted  . Physical exam 01/09/2018  . SVD (spontaneous vaginal delivery) 12/04/2017  . Postpartum care following vaginal delivery (7/15) 12/04/2017    Brenda Freeman 04/23/2018, 1:02 PM  [place], [place], [address] Phone: [phone]   Fax:  [phone]  Name: Chrys Landgrebe MRN: [id number] Date of Birth: March 07, 1996   [name], PT 04/23/18 1:02 PM Phone: [phone] Fax: [phone]

## 2018-04-24 ENCOUNTER — Encounter: Payer: Self-pay | Admitting: Physical Therapy

## 2018-04-24 ENCOUNTER — Ambulatory Visit: Payer: 59 | Admitting: Physical Therapy

## 2018-04-24 DIAGNOSIS — M545 Low back pain, unspecified: Secondary | ICD-10-CM

## 2018-04-24 DIAGNOSIS — M542 Cervicalgia: Secondary | ICD-10-CM

## 2018-04-24 DIAGNOSIS — M546 Pain in thoracic spine: Secondary | ICD-10-CM

## 2018-04-24 NOTE — Therapy (Addendum)
[place], [place], [address] Phone: [phone]   Fax:  [phone]  Physical Therapy Treatment  Patient Details  Name: Brenda Freeman MRN: [id number] Date of Birth: 12-06-1995 Referring Provider (PT): Dr Birdie Riddle   Encounter Date: 04/24/2018  PT End of Session - 04/24/18 1339    Visit Number  3    Number of Visits  12    Date for PT Re-Evaluation  05/23/18    PT Start Time  1339   pt arrived late   PT Stop Time  1437    PT Time Calculation (min)  58 min    Activity Tolerance  Patient tolerated treatment well    Behavior During Therapy  [facility] for tasks assessed/performed       Past Medical History:  Diagnosis Date  . Allergy   . Chicken pox   . Frequent headaches     History reviewed. No pertinent surgical history.  There were no vitals filed for this visit.  Subjective Assessment - 04/24/18 1340    Subjective  Yesterday I began having pain in lower back after being asked about it but I had not before. Neck is uncomfortable.     Patient Stated Goals  get rid of the pain in the neck and back; return to normal activities                        [facility] Adult PT Treatment/Exercise - 04/24/18 0001      Exercises   Exercises  Shoulder;Lumbar      Neck Exercises: Seated   Neck Retraction  10 reps      Lumbar Exercises: Stretches   Other Lumbar Stretch Exercise  child pose   modified-standing head on pillow arms on plinth & sink pull     Lumbar Exercises: Supine   AB Set Limitations  hooklying ab set      Shoulder Exercises: Supine   Horizontal ABduction  15 reps    Theraband Level (Shoulder Horizontal ABduction)  Level 2 (Red)      Shoulder Exercises: ROM/Strengthening   UBE (Upper Arm Bike)  retro 3 min L1`      Shoulder Exercises: Stretch   Other Shoulder Stretches  open book x3 each      Moist Heat Therapy   Number Minutes Moist Heat  15 Minutes   with estim   Moist Heat  Location  Cervical      Electrical Stimulation   Electrical Stimulation Location  lower cervical/upper throacic    Electrical Stimulation Action  IFC    Electrical Stimulation Parameters  to tolerance with heat    Electrical Stimulation Goals  Pain      Manual Therapy   Manual Therapy  Joint mobilization    Joint Mobilization  prone thoracic and rib PA             PT Education - 04/24/18 1431    Education Details  TPDN, anatomy of condition, breast feeding position, computer posture    Person(s) Educated  Patient    Methods  Explanation    Comprehension  Verbalized understanding          PT Long Term Goals - 04/10/18 1305      PT LONG TERM GOAL #1   Title  Decrease pain to 0/10 to 2/10 at most with all functional activities and at rest 05/23/18    Time  6    Period  Weeks  Status  New      PT LONG TERM GOAL #2   Title  Increased cervical ROM by 5-10 deg in all planes 05/23/18    Time  6    Period  Weeks    Status  New      PT LONG TERM GOAL #3   Title  Patient demonstrates improved posture and alignment with posterior shoulder girdle engaged 05/23/18    Time  6    Period  Weeks    Status  New      PT LONG TERM GOAL #4   Title  Independent in HEP 05/23/18    Time  6    Period  Weeks    Status  New      PT LONG TERM GOAL #5   Title  Improve FOTO to </= 23% limitation 05/23/18    Time  6    Period  Weeks    Status  New            Plan - 04/24/18 1426    Clinical Impression Statement  Significant limitation in thoracic joint mobility- reported decreased pulling after manual therapy. would like to try dry needling at next appointment.     PT Treatment/Interventions  Patient/family education;ADLs/Self Care Home Management;Cryotherapy;Electrical Stimulation;Iontophoresis 4mg /ml Dexamethasone;Moist Heat;Ultrasound;Dry needling;Manual techniques;Neuromuscular re-education;Therapeutic activities;Therapeutic exercise    PT Next Visit Plan  DN bil upper traps,  paraspinals    PT Home Exercise Plan  doorway/corner stretch, chin tuck, scap squeeze, horizontal abd red and ER red; open book, hooklying ab set    Consulted and Agree with Plan of Care  Patient       Patient will benefit from skilled therapeutic intervention in order to improve the following deficits and impairments:  Postural dysfunction, Improper body mechanics, Pain, Increased fascial restricitons, Increased muscle spasms, Decreased mobility, Decreased range of motion, Decreased activity tolerance  Visit Diagnosis: Cervicalgia  Pain in thoracic spine  Acute bilateral low back pain without sciatica     Problem List Patient Active Problem List   Diagnosis Date Noted  . Physical exam 01/09/2018  . SVD (spontaneous vaginal delivery) 12/04/2017  . Postpartum care following vaginal delivery (7/15) 12/04/2017    Brenda Freeman C. Shelanda Duvall PT, DPT 04/24/18 2:33 PM   [place] [facility] [address] [place], [place], [address] Phone: [phone]   Fax:  [phone]  Name: Brenda Freeman MRN: [id number] Date of Birth: 09-10-1995

## 2018-04-25 ENCOUNTER — Telehealth: Payer: Self-pay | Admitting: Family Medicine

## 2018-04-25 ENCOUNTER — Ambulatory Visit: Payer: 59 | Admitting: Physical Therapy

## 2018-04-25 NOTE — Telephone Encounter (Signed)
Forms dropped off for medical accomodation. Placed in bin for pick up.

## 2018-04-29 ENCOUNTER — Ambulatory Visit: Payer: 59 | Admitting: Physical Therapy

## 2018-04-29 ENCOUNTER — Encounter: Payer: Self-pay | Admitting: Physical Therapy

## 2018-04-29 DIAGNOSIS — M546 Pain in thoracic spine: Secondary | ICD-10-CM

## 2018-04-29 DIAGNOSIS — R29898 Other symptoms and signs involving the musculoskeletal system: Secondary | ICD-10-CM

## 2018-04-29 DIAGNOSIS — M545 Low back pain, unspecified: Secondary | ICD-10-CM

## 2018-04-29 DIAGNOSIS — M542 Cervicalgia: Secondary | ICD-10-CM | POA: Diagnosis not present

## 2018-04-29 DIAGNOSIS — R293 Abnormal posture: Secondary | ICD-10-CM

## 2018-04-29 NOTE — Telephone Encounter (Signed)
Form placed on Dr. Tamela Oddi desk for review

## 2018-04-29 NOTE — Patient Instructions (Signed)

## 2018-04-29 NOTE — Therapy (Signed)
[place], [place], [address] Phone: [phone]   Fax:  [phone]  Physical Therapy Treatment  Patient Details  Name: Brenda Freeman MRN: [id number] Date of Birth: 04-15-1996 Referring Provider (PT): Dr Birdie Riddle   Encounter Date: 04/29/2018  PT End of Session - 04/29/18 1300    Visit Number  4    Number of Visits  12    Date for PT Re-Evaluation  05/23/18    Authorization Type  UHC    PT Start Time  1200    PT Stop Time  1242    PT Time Calculation (min)  42 min    Activity Tolerance  Patient tolerated treatment well    Behavior During Therapy  [facility] for tasks assessed/performed       Past Medical History:  Diagnosis Date  . Allergy   . Chicken pox   . Frequent headaches     History reviewed. No pertinent surgical history.  There were no vitals filed for this visit.  Subjective Assessment - 04/29/18 1200    Subjective  15 min late to start tx. due to pt. running late. Primary pain complaint is cervical and upper thoracic region pain. Plan from last session was to try dry needling today-pt. consents to tx.    Currently in Pain?  Yes    Pain Score  4     Pain Location  Neck   CT junction region, paraspinals, upper traps bilaterally   Pain Orientation  Posterior;Right;Left    Pain Descriptors / Indicators  Discomfort    Pain Type  Acute pain    Pain Onset  More than a month ago    Pain Frequency  Intermittent    Aggravating Factors   sitting, postural limitations sitting or laying on side    Pain Relieving Factors  no eases noted other than medication                       [facility] Adult PT Treatment/Exercise - 04/29/18 0001      Shoulder Exercises: Seated   External Rotation  Strengthening;Both;15 reps   standing with back on verical foam roll at wall   Theraband Level (Shoulder External Rotation)  Level 1 (Yellow)      Shoulder Exercises: Standing   Other Standing Exercises   Theraband row x 15 with Green band      Shoulder Exercises: ROM/Strengthening   UBE (Upper Arm Bike)  2 min ea. fw/rev L1      Moist Heat Therapy   Number Minutes Moist Heat  10 Minutes    Moist Heat Location  Cervical   supine, pt. declined estim     Manual Therapy   Manual Therapy  Joint mobilization;Soft tissue mobilization    Manual therapy comments  skilled palpation for dry needling    Joint Mobilization  thoracic PAs grade I-IV    Soft tissue mobilization  cervical/CT junction region paraspinals, upper trapezius       Trigger Point Dry Needling - 04/29/18 1257    Consent Given?  Yes    Education Handout Provided  Yes    Muscles Treated Upper Body  Upper trapezius   also cervical multifi C4-6 bilat., splenius cervicis bilat.   Upper Trapezius Response  Twitch reponse elicited           PT Education - 04/29/18 1259    Education Details  POC, TPDN    Person(s) Educated  Patient  Methods  Explanation;Handout    Comprehension  Verbalized understanding          PT Long Term Goals - 04/10/18 1305      PT LONG TERM GOAL #1   Title  Decrease pain to 0/10 to 2/10 at most with all functional activities and at rest 05/23/18    Time  6    Period  Weeks    Status  New      PT LONG TERM GOAL #2   Title  Increased cervical ROM by 5-10 deg in all planes 05/23/18    Time  6    Period  Weeks    Status  New      PT LONG TERM GOAL #3   Title  Patient demonstrates improved posture and alignment with posterior shoulder girdle engaged 05/23/18    Time  6    Period  Weeks    Status  New      PT LONG TERM GOAL #4   Title  Independent in HEP 05/23/18    Time  6    Period  Weeks    Status  New      PT LONG TERM GOAL #5   Title  Improve FOTO to </= 23% limitation 05/23/18    Time  6    Period  Weeks    Status  New            Plan - 04/29/18 1301    Clinical Impression Statement  Continues with upper trapezius and cervical/CT junction region muscle tension. Tx.  today including trial dry needling well-tolerated-expect may take 1-2 days to note results so will await further tx. response.    PT Frequency  2x / week    PT Duration  6 weeks    PT Treatment/Interventions  Patient/family education;ADLs/Self Care Home Management;Cryotherapy;Electrical Stimulation;Iontophoresis 4mg /ml Dexamethasone;Moist Heat;Ultrasound;Dry needling;Manual techniques;Neuromuscular re-education;Therapeutic activities;Therapeutic exercise    PT Next Visit Plan  check response dry needling, cervical/thoracic ROM, stretches, manual tx. and modalities as needed    PT Home Exercise Plan  doorway/corner stretch, chin tuck, scap squeeze, horizontal abd red and ER red; open book, hooklying ab set    Consulted and Agree with Plan of Care  Patient       Patient will benefit from skilled therapeutic intervention in order to improve the following deficits and impairments:  Postural dysfunction, Improper body mechanics, Pain, Increased fascial restricitons, Increased muscle spasms, Decreased mobility, Decreased range of motion, Decreased activity tolerance  Visit Diagnosis: Cervicalgia  Pain in thoracic spine  Acute bilateral low back pain without sciatica  Other symptoms and signs involving the musculoskeletal system  Abnormal posture     Problem List Patient Active Problem List   Diagnosis Date Noted  . Physical exam 01/09/2018  . SVD (spontaneous vaginal delivery) 12/04/2017  . Postpartum care following vaginal delivery (7/15) 12/04/2017    Beaulah Dinning, PT, DPT 04/29/18 1:06 PM  [place], [place], [address] Phone: [phone]   Fax:  [phone]  Name: Brenda Freeman MRN: [id number] Date of Birth: May 30, 1995

## 2018-04-29 NOTE — Telephone Encounter (Signed)
Reviewed disability forms.  Recommend office visit for f/u: now in PT per Dr. Birdie Riddle. Needs f/u with Dr. Birdie Riddle and to clarify restrictions/accomodations. I saw her acutely; we did not discuss any restrictions or disability at that time.

## 2018-04-29 NOTE — Telephone Encounter (Signed)
Forms given to [facility].   Doloris Hall,  LPN

## 2018-05-02 ENCOUNTER — Encounter: Payer: Self-pay | Admitting: Physical Therapy

## 2018-05-02 ENCOUNTER — Ambulatory Visit: Payer: 59 | Admitting: Physical Therapy

## 2018-05-02 DIAGNOSIS — R29898 Other symptoms and signs involving the musculoskeletal system: Secondary | ICD-10-CM

## 2018-05-02 DIAGNOSIS — M542 Cervicalgia: Secondary | ICD-10-CM | POA: Diagnosis not present

## 2018-05-02 DIAGNOSIS — M546 Pain in thoracic spine: Secondary | ICD-10-CM

## 2018-05-02 DIAGNOSIS — M545 Low back pain, unspecified: Secondary | ICD-10-CM

## 2018-05-02 DIAGNOSIS — R293 Abnormal posture: Secondary | ICD-10-CM

## 2018-05-02 NOTE — Therapy (Signed)
[place], [place], [address] Phone: [phone]   Fax:  [phone]  Physical Therapy Treatment  Patient Details  Name: Brenda Freeman MRN: [id number] Date of Birth: Jan 03, 1996 Referring Provider (PT): Dr Birdie Riddle   Encounter Date: 05/02/2018  PT End of Session - 05/02/18 1229    Visit Number  6    Number of Visits  12    Date for PT Re-Evaluation  05/23/18    Authorization Type  UHC    PT Start Time  5638    PT Stop Time  1239    PT Time Calculation (min)  46 min    Activity Tolerance  Patient tolerated treatment well    Behavior During Therapy  [facility] for tasks assessed/performed       Past Medical History:  Diagnosis Date  . Allergy   . Chicken pox   . Frequent headaches     History reviewed. No pertinent surgical history.  There were no vitals filed for this visit.  Subjective Assessment - 05/02/18 1200    Subjective  Pt relays some soreness after DN but relays overall she liked it and felt it helped. She relays no pain upon arrival just continued discomfort and soreness    Currently in Pain?  No/denies         [facility] PT Assessment - 05/02/18 0001      Assessment   Medical Diagnosis  Cervical dysfunction; LBP     Referring Provider (PT)  Dr Birdie Riddle    Onset Date/Surgical Date  03/24/18    Hand Dominance  Right    Next MD Visit  PRN       AROM   Cervical Flexion  53    Cervical Extension  45    Cervical - Right Side Bend  40    Cervical - Left Side Bend  40    Cervical - Right Rotation  55    Cervical - Left Rotation  55    Lumbar Flexion  95%    Lumbar Extension  90% pain    Lumbar - Right Side Bend  WNL    Lumbar - Left Side Bend  WNL    Lumbar - Right Rotation  WNL    Lumbar - Left Rotation  WNL                   [facility] Adult PT Treatment/Exercise - 05/02/18 0001      Neck Exercises: Machines for Strengthening   UBE (Upper Arm Bike)  3 min fwd/3 min back L1      Neck Exercises: Seated   Cervical Rotation  Both;10 reps    Cervical Rotation Limitations  AAROM MWM with towel 5 sec hold      Shoulder Exercises: Seated   External Rotation  Strengthening;Both;20 reps    Theraband Level (Shoulder External Rotation)  Level 1 (Yellow)      Shoulder Exercises: Standing   Other Standing Exercises  Theraband row x 20 with Green band      Moist Heat Therapy   Number Minutes Moist Heat  7 Minutes    Moist Heat Location  Cervical      Manual Therapy   Manual Therapy  Joint mobilization;Soft tissue mobilization    Joint Mobilization  C-T spine central PA mobs grade 2-3    Soft tissue mobilization  cervical/CT junction region paraspinals, upper trapezius  PT Long Term Goals - 05/02/18 1228      PT LONG TERM GOAL #1   Title  Decrease pain to 0/10 to 2/10 at most with all functional activities and at rest 05/23/18    Time  6    Period  Weeks    Status  On-going      PT LONG TERM GOAL #2   Title  Increased cervical ROM by 5-10 deg in all planes 05/23/18    Time  6    Period  Weeks    Status  Partially Met      PT LONG TERM GOAL #3   Title  Patient demonstrates improved posture and alignment with posterior shoulder girdle engaged 05/23/18    Period  Weeks    Status  Partially Met      PT LONG TERM GOAL #4   Title  Independent in HEP 05/23/18    Period  Weeks    Status  Partially Met      PT LONG TERM GOAL #5   Title  Improve FOTO to </= 23% limitation 05/23/18    Time  6    Period  Weeks    Status  On-going            Plan - 05/02/18 1230    Clinical Impression Statement  [employer] and goal updated today which do show progress with ROM and tightness but she does still have deficits in this area. She is making steady progress with PT and will continue to benefit from it.     Rehab Potential  Good    PT Frequency  2x / week    PT Duration  6 weeks    PT Treatment/Interventions  Patient/family education;ADLs/Self  Care Home Management;Cryotherapy;Electrical Stimulation;Iontophoresis 57m/ml Dexamethasone;Moist Heat;Ultrasound;Dry needling;Manual techniques;Neuromuscular re-education;Therapeutic activities;Therapeutic exercise    PT Next Visit Plan  dry needling as needed, cervical/thoracic ROM, stretches, manual tx. and modalities as needed    PT Home Exercise Plan  doorway/corner stretch, chin tuck, scap squeeze, horizontal abd red and ER red; open book, hooklying ab set    Consulted and Agree with Plan of Care  Patient       Patient will benefit from skilled therapeutic intervention in order to improve the following deficits and impairments:  Postural dysfunction, Improper body mechanics, Pain, Increased fascial restricitons, Increased muscle spasms, Decreased mobility, Decreased range of motion, Decreased activity tolerance  Visit Diagnosis: Cervicalgia  Pain in thoracic spine  Acute bilateral low back pain without sciatica  Other symptoms and signs involving the musculoskeletal system  Abnormal posture     Problem List Patient Active Problem List   Diagnosis Date Noted  . Physical exam 01/09/2018  . SVD (spontaneous vaginal delivery) 12/04/2017  . Postpartum care following vaginal delivery (7/15) 12/04/2017    BDebbe Odea PT,DPT 05/02/2018, 12:36 PM  [place] [address]Phone: [phone]  Fax:  [phone] Name: Brenda PaciniMRN: [id number] of Birth: [birth year]

## 2018-05-03 ENCOUNTER — Encounter: Payer: Self-pay | Admitting: Family Medicine

## 2018-05-03 ENCOUNTER — Ambulatory Visit (INDEPENDENT_AMBULATORY_CARE_PROVIDER_SITE_OTHER): Payer: 59 | Admitting: Family Medicine

## 2018-05-03 ENCOUNTER — Ambulatory Visit: Payer: 59 | Admitting: Family Medicine

## 2018-05-03 ENCOUNTER — Other Ambulatory Visit: Payer: Self-pay

## 2018-05-03 VITALS — BP 122/82 | HR 67 | Temp 98.0°F | Resp 16 | Ht 64.0 in | Wt 134.4 lb

## 2018-05-03 DIAGNOSIS — G44309 Post-traumatic headache, unspecified, not intractable: Secondary | ICD-10-CM

## 2018-05-03 DIAGNOSIS — M62838 Other muscle spasm: Secondary | ICD-10-CM

## 2018-05-03 MED ORDER — BACLOFEN 10 MG PO TABS: ORAL_TABLET | ORAL | 0 refills | Status: DC

## 2018-05-03 NOTE — Patient Instructions (Signed)
Follow up as needed or as scheduled Continue Ibuprofen as needed for pain/inflammation Continue w/ PT- I'm glad it's helping! Use the Baclofen as needed for muscle spasm/stiffness.  Start w/ 1/2 tab and increase to 1 tab if needed. HEAT!!! Call with any questions or concerns Happy Holidays!
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
## 2018-05-06 ENCOUNTER — Encounter: Payer: 59 | Admitting: Physical Therapy

## 2018-05-07 ENCOUNTER — Ambulatory Visit: Payer: 59 | Admitting: Physical Therapy

## 2018-05-07 ENCOUNTER — Encounter: Payer: Self-pay | Admitting: Physical Therapy

## 2018-05-07 DIAGNOSIS — M542 Cervicalgia: Secondary | ICD-10-CM | POA: Diagnosis not present

## 2018-05-07 DIAGNOSIS — M545 Low back pain, unspecified: Secondary | ICD-10-CM

## 2018-05-07 DIAGNOSIS — R293 Abnormal posture: Secondary | ICD-10-CM

## 2018-05-07 DIAGNOSIS — M546 Pain in thoracic spine: Secondary | ICD-10-CM

## 2018-05-07 DIAGNOSIS — R29898 Other symptoms and signs involving the musculoskeletal system: Secondary | ICD-10-CM

## 2018-05-07 NOTE — Therapy (Signed)
[place], [place], [address] Phone: [phone]   Fax:  [phone]  Physical Therapy Treatment  Patient Details  Name: Brenda Freeman MRN: [id number] Date of Birth: [birth year] Referring Provider (PT): Dr Birdie Riddle   Encounter Date: 05/07/2018  PT End of Session - 05/07/18 1111    Visit Number  7    Number of Visits  12    Date for PT Re-Evaluation  05/23/18    Authorization Type  UHC    PT Start Time  1108    PT Stop Time  1203    PT Time Calculation (min)  55 min    Activity Tolerance  Patient tolerated treatment well    Behavior During Therapy  [facility] for tasks assessed/performed       Past Medical History:  Diagnosis Date  . Allergy   . Chicken pox   . Frequent headaches     History reviewed. No pertinent surgical history.  There were no vitals filed for this visit.  Subjective Assessment - 05/07/18 1110    Subjective  Pt noted pain last week back pain, shoulders, below ears, jaw , 4/10 at times.  Almost went to the ED with a headache. Meds OTC min help, more headaches than neck . Dry needling helped.      Currently in Pain?  Yes    Pain Score  4     Pain Location  Neck    Pain Orientation  Right;Left;Posterior    Pain Descriptors / Indicators  Tightness;Discomfort    Pain Type  Acute pain    Pain Onset  More than a month ago    Pain Frequency  Intermittent    Aggravating Factors   posture    Pain Relieving Factors  heat     Multiple Pain Sites  No          [facility] Adult PT Treatment/Exercise - 05/07/18 0001      Neck Exercises: Supine   Neck Retraction  10 reps;5 secs    Cervical Rotation  Both;10 reps    Shoulder Flexion  Both;10 reps    Shoulder Flexion Limitations  alternating     Other Supine Exercise  arm circles, horizontal pull red band x10 on foam roller for above ex     Other Supine Exercise  narrow grip red x 10       Lumbar Exercises: Supine   Dead Bug  5 reps    Dead  Bug Limitations  on foam, slow hold 5 sec       Moist Heat Therapy   Number Minutes Moist Heat  10 Minutes    Moist Heat Location  Cervical      Manual Therapy   Manual Therapy  Joint mobilization;Soft tissue mobilization;Myofascial release;Passive ROM;Manual Traction    Joint Mobilization  lateral glides mid cervical     Soft tissue mobilization  bilateral upper traps, lateral cervicals L>R, scalenes, suboccipitals     Myofascial Release  cervicals     Passive ROM  lateral flexion and rotation     Manual Traction  gentle subocc. release              PT Education - 05/07/18 1243    Education Details  crossed syndrome, deep neck flexors, stabilization.     Person(s) Educated  Patient    Methods  Explanation;Demonstration    Comprehension  Verbalized understanding;Returned demonstration;Verbal cues required          PT  Long Term Goals - 05/02/18 1228      PT LONG TERM GOAL #1   Title  Decrease pain to 0/10 to 2/10 at most with all functional activities and at rest 05/23/18    Time  6    Period  Weeks    Status  On-going      PT LONG TERM GOAL #2   Title  Increased cervical ROM by 5-10 deg in all planes 05/23/18    Time  6    Period  Weeks    Status  Partially Met      PT LONG TERM GOAL #3   Title  Patient demonstrates improved posture and alignment with posterior shoulder girdle engaged 05/23/18    Period  Weeks    Status  Partially Met      PT LONG TERM GOAL #4   Title  Independent in HEP 05/23/18    Period  Weeks    Status  Partially Met      PT LONG TERM GOAL #5   Title  Improve FOTO to </= 23% limitation 05/23/18    Time  6    Period  Weeks    Status  On-going            Plan - 05/07/18 1244    Clinical Impression Statement  Patient with only min improvement in pain and function.  HEr headaches seem to be more of the issue.  Challenged core and stabilization on the foam roller today while releasing thoracic spine. L side of neck very tense compared to R.       PT Treatment/Interventions  Patient/family education;ADLs/Self Care Home Management;Cryotherapy;Electrical Stimulation;Iontophoresis 12m/ml Dexamethasone;Moist Heat;Ultrasound;Dry needling;Manual techniques;Neuromuscular re-education;Therapeutic activities;Therapeutic exercise    PT Next Visit Plan  dry needling as needed, cervical/thoracic ROM, stretches, manual tx. and modalities as needed    PT Home Exercise Plan  doorway/corner stretch, chin tuck, scap squeeze, horizontal abd red and ER red; open book, hooklying ab set    Consulted and Agree with Plan of Care  Patient       Patient will benefit from skilled therapeutic intervention in order to improve the following deficits and impairments:  Postural dysfunction, Improper body mechanics, Pain, Increased fascial restricitons, Increased muscle spasms, Decreased mobility, Decreased range of motion, Decreased activity tolerance  Visit Diagnosis: Cervicalgia  Pain in thoracic spine  Acute bilateral low back pain without sciatica  Other symptoms and signs involving the musculoskeletal system  Abnormal posture     Problem List Patient Active Problem List   Diagnosis Date Noted  . Physical exam 01/09/2018  . SVD (spontaneous vaginal delivery) 12/04/2017  . Postpartum care following vaginal delivery (7/15) 12/04/2017    Brenda Freeman 05/07/2018, 12:45 PM  [place] [address]Phone: [phone]  Fax:  [phone] Name: Brenda SoberanisMRN: [id number] of Birth: 4November 22, 1997 [name] PT 05/07/18 12:45 PM Phone: [phone]: [phone]

## 2018-05-08 ENCOUNTER — Encounter: Payer: 59 | Admitting: Physical Therapy

## 2018-05-09 ENCOUNTER — Encounter: Payer: Self-pay | Admitting: Physical Therapy

## 2018-05-09 ENCOUNTER — Ambulatory Visit: Payer: 59 | Admitting: Physical Therapy

## 2018-05-09 DIAGNOSIS — M546 Pain in thoracic spine: Secondary | ICD-10-CM

## 2018-05-09 DIAGNOSIS — M542 Cervicalgia: Secondary | ICD-10-CM

## 2018-05-09 NOTE — Therapy (Signed)
[place], [place], [address] Phone: [phone]   Fax:  [phone]  Physical Therapy Treatment  Patient Details  Name: Brenda Freeman MRN: [id number] Date of Birth: Jul 21, 1995 Referring Provider (PT): Dr Birdie Riddle   Encounter Date: 05/09/2018  PT End of Session - 05/09/18 1335    Visit Number  8    Number of Visits  12    Date for PT Re-Evaluation  05/23/18    Authorization Type  UHC    PT Start Time  1607    PT Stop Time  1415    PT Time Calculation (min)  41 min    Activity Tolerance  Patient tolerated treatment well    Behavior During Therapy  [facility] for tasks assessed/performed       Past Medical History:  Diagnosis Date  . Allergy   . Chicken pox   . Frequent headaches     History reviewed. No pertinent surgical history.  There were no vitals filed for this visit.  Subjective Assessment - 05/09/18 1336    Subjective  Had a 3 day HA that eventually wore down. Had a back spasm 2 days ago. Neck feels tight when I turn and like it needs to be stretched. using pillows to support during nursing.     Patient Stated Goals  get rid of the pain in the neck and back; return to normal activities                        [facility] Adult PT Treatment/Exercise - 05/09/18 0001      Manual Therapy   Manual Therapy  Taping    Manual therapy comments  skilled palpation for dry needling    Joint Mobilization  rib ER- with & without breathing, tspine PA    Soft tissue mobilization  bil upper traps, levator, rhomboids    Kinesiotex  --   upper trap inhibition, rhomboid facilitation      Trigger Point Dry Needling - 05/09/18 1416    Muscles Treated Upper Body  Upper trapezius;Rhomboids    Upper Trapezius Response  Twitch reponse elicited;Palpable increased muscle length   right   Rhomboids Response  Twitch response elicited;Palpable increased muscle length   bilat          PT Education -  05/09/18 1509    Education Details  anatomy of condition, postures with baby, tape, TPDN & expected outcomes    Person(s) Educated  Patient    Methods  Explanation    Comprehension  Verbalized understanding;Need further instruction          PT Long Term Goals - 05/02/18 1228      PT LONG TERM GOAL #1   Title  Decrease pain to 0/10 to 2/10 at most with all functional activities and at rest 05/23/18    Time  6    Period  Weeks    Status  On-going      PT LONG TERM GOAL #2   Title  Increased cervical ROM by 5-10 deg in all planes 05/23/18    Time  6    Period  Weeks    Status  Partially Met      PT LONG TERM GOAL #3   Title  Patient demonstrates improved posture and alignment with posterior shoulder girdle engaged 05/23/18    Period  Weeks    Status  Partially Met      PT LONG TERM GOAL #  4   Title  Independent in HEP 05/23/18    Period  Weeks    Status  Partially Met      PT LONG TERM GOAL #5   Title  Improve FOTO to </= 23% limitation 05/23/18    Time  6    Period  Weeks    Status  On-going            Plan - 05/09/18 1507    Clinical Impression Statement  TPDN decreased concordant tightness and ktape applied to support posture. Significant soreness in right upper trap so I asked her to keep moving for about an hour before sitting still with heat. Has made changes to posture while breast feeding which seems to help. Cavitations at costovertebral joints on right side with grade 4 mobilizations.     PT Treatment/Interventions  Patient/family education;ADLs/Self Care Home Management;Cryotherapy;Electrical Stimulation;Iontophoresis 1m/ml Dexamethasone;Moist Heat;Ultrasound;Dry needling;Manual techniques;Neuromuscular re-education;Therapeutic activities;Therapeutic exercise    PT Next Visit Plan  dry needling as needed, cervical/thoracic ROM, stretches, manual tx. and modalities as needed    PT Home Exercise Plan  doorway/corner stretch, chin tuck, scap squeeze, horizontal abd red  and ER red; open book, hooklying ab set    Consulted and Agree with Plan of Care  Patient       Patient will benefit from skilled therapeutic intervention in order to improve the following deficits and impairments:  Postural dysfunction, Improper body mechanics, Pain, Increased fascial restricitons, Increased muscle spasms, Decreased mobility, Decreased range of motion, Decreased activity tolerance  Visit Diagnosis: Cervicalgia  Pain in thoracic spine     Problem List Patient Active Problem List   Diagnosis Date Noted  . Physical exam 01/09/2018  . SVD (spontaneous vaginal delivery) 12/04/2017  . Postpartum care following vaginal delivery (7/15) 12/04/2017    Darcie Mellone C. Rachael Zapanta PT, DPT 05/09/18 3:11 PM   [place] [place] [address]Phone: [phone]  Fax:  [phone] Name: Brenda KubeMRN: [id number] of Birth: 4July 05, 1997

## 2018-05-13 ENCOUNTER — Encounter: Payer: 59 | Admitting: Physical Therapy

## 2018-05-14 ENCOUNTER — Encounter: Payer: Self-pay | Admitting: Physical Therapy

## 2018-05-14 ENCOUNTER — Ambulatory Visit: Payer: 59 | Admitting: Physical Therapy

## 2018-05-14 DIAGNOSIS — M545 Low back pain, unspecified: Secondary | ICD-10-CM

## 2018-05-14 DIAGNOSIS — R293 Abnormal posture: Secondary | ICD-10-CM

## 2018-05-14 DIAGNOSIS — R29898 Other symptoms and signs involving the musculoskeletal system: Secondary | ICD-10-CM

## 2018-05-14 DIAGNOSIS — M542 Cervicalgia: Secondary | ICD-10-CM | POA: Diagnosis not present

## 2018-05-14 DIAGNOSIS — M546 Pain in thoracic spine: Secondary | ICD-10-CM

## 2018-05-14 NOTE — Therapy (Signed)
[place], [place], [address] Phone: [phone]   Fax:  [phone]  Physical Therapy Treatment  Patient Details  Name: Brenda Freeman MRN: [id number] Date of Birth: Jul 16, 1995 Referring Provider (PT): Dr Birdie Riddle   Encounter Date: 05/14/2018  PT End of Session - 05/14/18 1156    Visit Number  9    Number of Visits  12    Date for PT Re-Evaluation  05/23/18    Authorization Type  UHC    PT Start Time  1150    PT Stop Time  1233    PT Time Calculation (min)  43 min    Activity Tolerance  Patient tolerated treatment well    Behavior During Therapy  [facility] for tasks assessed/performed       Past Medical History:  Diagnosis Date  . Allergy   . Chicken pox   . Frequent headaches     History reviewed. No pertinent surgical history.  There were no vitals filed for this visit.  Subjective Assessment - 05/14/18 1152    Subjective  Has had some back and head spasms the past couple days.  The needling really helped my thoracic.  No Neck pain , just stiffness.     Currently in Pain?  No/denies       [facility] Adult PT Treatment/Exercise - 05/14/18 0001      Shoulder Exercises: Standing   Horizontal ABduction  Strengthening;Both;12 reps    Theraband Level (Shoulder Horizontal ABduction)  Level 3 (Green)    Retraction  Strengthening;Both;10 reps      Shoulder Exercises: ROM/Strengthening   Wall Pushups  10 reps    Modified Plank  3 reps    Modified Plank Limitations  quadruped x 10 sec     "W" Arms  x 10 against wall     Other ROM/Strengthening Exercises  isometric against wall , goal post     Other ROM/Strengthening Exercises  standing protraction for scapular mobs       Manual Therapy   Joint Mobilization  lateral glides cervicals and thoracic rotational mobs    Soft tissue mobilization  bil upper traps, suboccipitals ,  levator, rhomboids    Passive ROM  Rt UE, cervical sidebending and rotation     Manual Traction  gentle subocc. release     Kinesiotex  Barrister's clerk;Inhibit Muscle      Kinesiotix   Create Space  upper trap     Inhibit Muscle   R upper trap, levator                  PT Long Term Goals - 05/14/18 1156      PT LONG TERM GOAL #1   Title  Decrease pain to 0/10 to 2/10 at most with all functional activities and at rest 05/23/18    Status  On-going      PT LONG TERM GOAL #2   Title  Increased cervical ROM by 5-10 deg in all planes 05/23/18    Status  Partially Met      PT LONG TERM GOAL #3   Title  Patient demonstrates improved posture and alignment with posterior shoulder girdle engaged 05/23/18    Status  Partially Met      PT LONG TERM GOAL #4   Title  Independent in HEP 05/23/18    Status  Partially Met      PT LONG TERM GOAL #5   Title  Improve FOTO to </= 23%  limitation 05/23/18    Status  Unable to assess            Plan - 05/14/18 1158    Clinical Impression Statement  Pt with improved pressure in her head and neck pain following last visit.  Improving in her tolerance for work, sitting upright and feeding the baby. Very diligent about self care and HEP     PT Treatment/Interventions  Patient/family education;ADLs/Self Care Home Management;Cryotherapy;Electrical Stimulation;Iontophoresis 90m/ml Dexamethasone;Moist Heat;Ultrasound;Dry needling;Manual techniques;Neuromuscular re-education;Therapeutic activities;Therapeutic exercise    PT Next Visit Plan  dry needling as needed, cervical/thoracic ROM, stretches, manual tx. and modalities as needed    PT Home Exercise Plan  doorway/corner stretch, chin tuck, scap squeeze, horizontal abd red and ER red; open book, hooklying ab set    Consulted and Agree with Plan of Care  Patient       Patient will benefit from skilled therapeutic intervention in order to improve the following deficits and impairments:  Postural dysfunction, Improper body mechanics, Pain, Increased fascial restricitons, Increased  muscle spasms, Decreased mobility, Decreased range of motion, Decreased activity tolerance  Visit Diagnosis: Pain in thoracic spine  Cervicalgia  Acute bilateral low back pain without sciatica  Other symptoms and signs involving the musculoskeletal system  Abnormal posture     Problem List Patient Active Problem List   Diagnosis Date Noted  . Physical exam 01/09/2018  . SVD (spontaneous vaginal delivery) 12/04/2017  . Postpartum care following vaginal delivery (7/15) 12/04/2017    PAA,JENNIFER 05/14/2018, 12:44 PM  [place] [address]Phone: [phone]  Fax:  [phone] Name: Brenda BacksMRN: [id number] of Birth: 402/06/1995 [name] PT 05/14/18 12:44 PM Phone: [phone]: [phone]

## 2018-05-16 ENCOUNTER — Encounter: Payer: Self-pay | Admitting: Physical Therapy

## 2018-05-16 ENCOUNTER — Ambulatory Visit: Payer: 59 | Admitting: Physical Therapy

## 2018-05-16 DIAGNOSIS — R293 Abnormal posture: Secondary | ICD-10-CM

## 2018-05-16 DIAGNOSIS — R29898 Other symptoms and signs involving the musculoskeletal system: Secondary | ICD-10-CM

## 2018-05-16 DIAGNOSIS — M546 Pain in thoracic spine: Secondary | ICD-10-CM

## 2018-05-16 DIAGNOSIS — M545 Low back pain, unspecified: Secondary | ICD-10-CM

## 2018-05-16 DIAGNOSIS — M542 Cervicalgia: Secondary | ICD-10-CM

## 2018-05-16 NOTE — Therapy (Signed)
[place], [place], [address] Phone: [phone]   Fax:  [phone]  Physical Therapy Treatment/Renewal  Patient Details  Name: Brenda Freeman MRN: [id number] Date of Birth: [birth year] Referring Provider (PT): Dr Birdie Riddle   Encounter Date: 05/16/2018  PT End of Session - 05/16/18 1429    Visit Number  10    Number of Visits  --    Date for PT Re-Evaluation  06/13/18    Authorization Type  UHC    PT Start Time  0947    PT Stop Time  1500    PT Time Calculation (min)  40 min    Activity Tolerance  Patient tolerated treatment well    Behavior During Therapy  [facility] for tasks assessed/performed       Past Medical History:  Diagnosis Date  . Allergy   . Chicken pox   . Frequent headaches     History reviewed. No pertinent surgical history.  There were no vitals filed for this visit.  Subjective Assessment - 05/16/18 1421    Subjective  Had some back spasm.  Spasm on the side of the head.  Neck is OK.  Pressure is so much improved.           [facility] Adult PT Treatment/Exercise - 05/16/18 0001      Neck Exercises: Machines for Strengthening   UBE (Upper Arm Bike)  unable to finish due to sharp pain in neck R       Neck Exercises: Stabilization   Stabilization  supine with ball under knees: narrow grip with chin tuck, horizontal abd green x 10       Lumbar Exercises: Quadruped   [place]cat/Old Horse  5 reps    Opposite Arm/Leg Raise  Right arm/Left leg;Left arm/Right leg;10 reps    Plank  3 x 15 sec on elbows       Shoulder Exercises: ROM/Strengthening   Other ROM/Strengthening Exercises  spinal rotation with head turns in supine     Other ROM/Strengthening Exercises  supine chin tuck with unilateral 5 lbs reach out       Manual Therapy   Manual Traction  gentle subocc. release       Neck Exercises: Stretches   Upper Trapezius Stretch  2 reps;30 seconds    Levator Stretch  2 reps;30 seconds              PT Education - 05/16/18 1557    Education Details  stabilization, core    Person(s) Educated  Patient    Methods  Explanation;Demonstration    Comprehension  Verbalized understanding          PT Long Term Goals - 05/14/18 1156      PT LONG TERM GOAL #1   Title  Decrease pain to 0/10 to 2/10 at most with all functional activities and at rest 05/23/18    Status  On-going      PT LONG TERM GOAL #2   Title  Increased cervical ROM by 5-10 deg in all planes 05/23/18    Status  Partially Met      PT LONG TERM GOAL #3   Title  Patient demonstrates improved posture and alignment with posterior shoulder girdle engaged 05/23/18    Status  Partially Met      PT LONG TERM GOAL #4   Title  Independent in HEP 05/23/18    Status  Partially Met      PT LONG TERM GOAL #  5   Title  Improve FOTO to </= 23% limitation 05/23/18    Status  Unable to assess            Plan - 05/16/18 1423    Clinical Impression Statement  FOTO score 15% worse than when she began PT.  She would like to continue for more dry needling visits as she found that to be most beneficial.  Worked on stabilization throughout spine and postural correction.  Does have intermittent spasms which seem to be musculoskeletal in nature.  She needs cues for stabilization throughout spine.  Stiffness in cervical sidebending but improving.     PT Frequency  2x / week    PT Duration  4 weeks    PT Treatment/Interventions  Patient/family education;ADLs/Self Care Home Management;Cryotherapy;Electrical Stimulation;Iontophoresis 53m/ml Dexamethasone;Moist Heat;Ultrasound;Dry needling;Manual techniques;Neuromuscular re-education;Therapeutic activities;Therapeutic exercise    PT Next Visit Plan  dry needling as needed, cervical/thoracic ROM, stretches, manual tx. and modalities as needed    PT Home Exercise Plan  doorway/corner stretch, chin tuck, scap squeeze, horizontal abd red and ER red; open book, hooklying ab set    Consulted  and Agree with Plan of Care  Patient       Patient will benefit from skilled therapeutic intervention in order to improve the following deficits and impairments:  Postural dysfunction, Improper body mechanics, Pain, Increased fascial restricitons, Increased muscle spasms, Decreased mobility, Decreased range of motion, Decreased activity tolerance  Visit Diagnosis: Pain in thoracic spine  Cervicalgia  Acute bilateral low back pain without sciatica  Other symptoms and signs involving the musculoskeletal system  Abnormal posture     Problem List Patient Active Problem List   Diagnosis Date Noted  . Physical exam 01/09/2018  . SVD (spontaneous vaginal delivery) 12/04/2017  . Postpartum care following vaginal delivery (7/15) 12/04/2017    PAA,JENNIFER 05/16/2018, 4:03 PM  [place] [address]Phone: [phone]  Fax:  [phone] Name: JThaila BottomsMRN: [id number] of Birth: 4March 14, 1997 [name] PT 05/16/18 4:03 PM Phone: [phone]: [phone]

## 2018-05-28 ENCOUNTER — Ambulatory Visit: Payer: 59 | Admitting: Physical Therapy

## 2018-05-31 ENCOUNTER — Encounter

## 2018-05-31 ENCOUNTER — Ambulatory Visit: Payer: 59 | Admitting: Physical Therapy

## 2018-06-04 ENCOUNTER — Ambulatory Visit: Payer: 59 | Attending: Family Medicine | Admitting: Physical Therapy

## 2018-06-04 DIAGNOSIS — M545 Low back pain: Secondary | ICD-10-CM | POA: Insufficient documentation

## 2018-06-04 DIAGNOSIS — R293 Abnormal posture: Secondary | ICD-10-CM | POA: Insufficient documentation

## 2018-06-04 DIAGNOSIS — R29898 Other symptoms and signs involving the musculoskeletal system: Secondary | ICD-10-CM | POA: Insufficient documentation

## 2018-06-04 DIAGNOSIS — M542 Cervicalgia: Secondary | ICD-10-CM | POA: Insufficient documentation

## 2018-06-04 DIAGNOSIS — M546 Pain in thoracic spine: Secondary | ICD-10-CM | POA: Insufficient documentation

## 2018-06-07 ENCOUNTER — Ambulatory Visit: Payer: 59 | Admitting: Physical Therapy

## 2018-06-07 ENCOUNTER — Encounter: Payer: Self-pay | Admitting: Physical Therapy

## 2018-06-07 DIAGNOSIS — M545 Low back pain, unspecified: Secondary | ICD-10-CM

## 2018-06-07 DIAGNOSIS — M546 Pain in thoracic spine: Secondary | ICD-10-CM | POA: Diagnosis present

## 2018-06-07 DIAGNOSIS — M542 Cervicalgia: Secondary | ICD-10-CM | POA: Diagnosis present

## 2018-06-07 DIAGNOSIS — R293 Abnormal posture: Secondary | ICD-10-CM | POA: Diagnosis present

## 2018-06-07 DIAGNOSIS — R29898 Other symptoms and signs involving the musculoskeletal system: Secondary | ICD-10-CM | POA: Diagnosis present

## 2018-06-07 NOTE — Therapy (Signed)
[place], [place], [address] Phone: [phone]   Fax:  [phone]  Physical Therapy Treatment  Patient Details  Name: Brenda Freeman MRN: [id number] Date of Birth: [birth year] Referring Provider (PT): Dr Birdie Riddle   Encounter Date: 06/07/2018  PT End of Session - 06/07/18 0941    Visit Number  11    Number of Visits  12    Date for PT Re-Evaluation  06/13/18    Authorization Type  UHC    PT Start Time  [phone]   pt arrived late   PT Stop Time  1018    PT Time Calculation (min)  37 min    Activity Tolerance  Patient tolerated treatment well    Behavior During Therapy  [facility] for tasks assessed/performed       Past Medical History:  Diagnosis Date  . Allergy   . Chicken pox   . Frequent headaches     History reviewed. No pertinent surgical history.  There were no vitals filed for this visit.  Subjective Assessment - 06/07/18 0941    Subjective  Was not feeling well and little one is teething so I am not sleeping well. Had a back spasm on Wed.                        [facility] Adult PT Treatment/Exercise - 06/07/18 0001      Manual Therapy   Manual therapy comments  skilled palpation and monitoring during TPDN    Joint Mobilization  scapular mobilization, thoracic PA, gross rib ER    Soft tissue mobilization  upper trap, levator, subscap      Kinesiotix   Inhibit Muscle   Rt upper trap       Trigger Point Dry Needling - 06/07/18 1204    Muscles Treated Upper Body  Upper trapezius;Levator scapulae;Subscapularis    Levator Scapulae Response  Twitch response elicited;Palpable increased muscle length    Rhomboids Response  Twitch response elicited;Palpable increased muscle length    Subscapularis Response  Twitch response elicited;Palpable increased muscle length           PT Education - 06/07/18 1206    Education Details  bra wear, referred pain/HA    Person(s) Educated  Patient     Methods  Explanation    Comprehension  Verbalized understanding;Need further instruction          PT Long Term Goals - 05/14/18 1156      PT LONG TERM GOAL #1   Title  Decrease pain to 0/10 to 2/10 at most with all functional activities and at rest 05/23/18    Status  On-going      PT LONG TERM GOAL #2   Title  Increased cervical ROM by 5-10 deg in all planes 05/23/18    Status  Partially Met      PT LONG TERM GOAL #3   Title  Patient demonstrates improved posture and alignment with posterior shoulder girdle engaged 05/23/18    Status  Partially Met      PT LONG TERM GOAL #4   Title  Independent in HEP 05/23/18    Status  Partially Met      PT LONG TERM GOAL #5   Title  Improve FOTO to </= 23% limitation 05/23/18    Status  Unable to assess            Plan - 06/07/18 1201    Clinical Impression  Statement  Significant twitches and recreation of referred pain with palpation and DN today. Notable improvement in posture following manual therapy and ktape placed over Lt upper trap for depression and encouraging scapular retraction. Discussed bra wear- minimal support from nursing bras due to nature of build but to look for a wider strap to decrease specific pressure on upper traps. Limited scapular mobility addressed with manual techniques.     PT Treatment/Interventions  Patient/family education;ADLs/Self Care Home Management;Cryotherapy;Electrical Stimulation;Iontophoresis 63m/ml Dexamethasone;Moist Heat;Ultrasound;Dry needling;Manual techniques;Neuromuscular re-education;Therapeutic activities;Therapeutic exercise    PT Next Visit Plan  DN PRN, lower trap activation, pec stretching    PT Home Exercise Plan  doorway/corner stretch, chin tuck, scap squeeze, horizontal abd red and ER red; open book, hooklying ab set    Consulted and Agree with Plan of Care  Patient       Patient will benefit from skilled therapeutic intervention in order to improve the following deficits and  impairments:  Postural dysfunction, Improper body mechanics, Pain, Increased fascial restricitons, Increased muscle spasms, Decreased mobility, Decreased range of motion, Decreased activity tolerance  Visit Diagnosis: Pain in thoracic spine  Cervicalgia  Acute bilateral low back pain without sciatica     Problem List Patient Active Problem List   Diagnosis Date Noted  . Physical exam 01/09/2018  . SVD (spontaneous vaginal delivery) 12/04/2017  . Postpartum care following vaginal delivery (7/15) 12/04/2017   Fairley Copher C. Shirlette Scarber PT, DPT 06/07/18 12:07 PM   [facility] [address]Phone: [phone]  Fax:  [phone] Name: Brenda LeidnerMRN: [id number] of Birth: 4Jun 05, 1997

## 2018-06-11 ENCOUNTER — Ambulatory Visit: Payer: 59 | Admitting: Physical Therapy

## 2018-06-11 ENCOUNTER — Encounter: Payer: Self-pay | Admitting: Physical Therapy

## 2018-06-11 DIAGNOSIS — M545 Low back pain, unspecified: Secondary | ICD-10-CM

## 2018-06-11 DIAGNOSIS — R29898 Other symptoms and signs involving the musculoskeletal system: Secondary | ICD-10-CM

## 2018-06-11 DIAGNOSIS — M542 Cervicalgia: Secondary | ICD-10-CM

## 2018-06-11 DIAGNOSIS — M546 Pain in thoracic spine: Secondary | ICD-10-CM

## 2018-06-11 DIAGNOSIS — R293 Abnormal posture: Secondary | ICD-10-CM

## 2018-06-11 NOTE — Therapy (Signed)
[place], [place], [address] Phone: [phone]   Fax:  [phone]  Physical Therapy Treatment/ERO  Patient Details  Name: Brenda Freeman MRN: [id number] Date of Birth: 1996-03-08 Referring Provider (PT): Dr Birdie Riddle   Encounter Date: 06/11/2018  PT End of Session - 06/11/18 0939    Visit Number  12    Number of Visits  20    Date for PT Re-Evaluation  07/12/18    Authorization Type  UHC    PT Start Time  0940   pt arrived late   PT Stop Time  1026    PT Time Calculation (min)  46 min    Activity Tolerance  Patient tolerated treatment well    Behavior During Therapy  [facility] for tasks assessed/performed       Past Medical History:  Diagnosis Date  . Allergy   . Chicken pox   . Frequent headaches     History reviewed. No pertinent surgical history.  There were no vitals filed for this visit.  Subjective Assessment - 06/11/18 0942    Subjective  I was sore for a couple of days and had some HA pain but did not need to take anything, it passed on its own. Feels that overall DN has been helpful and no longer feels the specific issue in thoracic spine and a lot of Ha pain has decreased.     Patient Stated Goals  get rid of the pain in the neck and back; return to normal activities     Currently in Pain?  Yes    Pain Location  --   thoracic   Pain Orientation  Right    Pain Descriptors / Indicators  Headache    Aggravating Factors   posture    Pain Relieving Factors  posture correction, stretches         [facility] PT Assessment - 06/11/18 0001      Assessment   Medical Diagnosis  Cervical dysfunction; LBP     Referring Provider (PT)  Dr Birdie Riddle    Onset Date/Surgical Date  03/24/18    Hand Dominance  Right    Next MD Visit  PRN       Observation/Other Assessments   Focus on Therapeutic Outcomes (FOTO)   44% limited      Posture/Postural Control   Posture Comments  able to achieve improved alignment  with mild forward head, poor endurance       AROM   Cervical - Right Side Bend  35    Cervical - Left Side Bend  25    Cervical - Right Rotation  78    Cervical - Left Rotation  74      Palpation   Palpation comment  limited thoracic mobiliity, tightness suboccipitals                   [facility] Adult PT Treatment/Exercise - 06/11/18 0001      Neck Exercises: Stretches   Warehouse manager  Other (comment)   door pec stretch- variable heights   Other Neck Stretches  rhomboid stretch    Other Neck Stretches  thoracic ext foam roll             PT Education - 06/11/18 1215    Education Details  goals discussion, HA referral patterns, regular mobilization/stretching, POC, HEP    Person(s) Educated  Patient    Methods  Explanation;Demonstration;Tactile cues;Verbal cues;Handout    Comprehension  Verbalized understanding;Need  further instruction;Returned demonstration;Verbal cues required;Tactile cues required          PT Long Term Goals - 06/11/18 0949      PT LONG TERM GOAL #1   Title  Decrease pain to 0/10 to 2/10 at most with all functional activities and at rest     Baseline  average about 6/10- trying to keep headaches away    Status  On-going      PT LONG TERM GOAL #2   Title  Increased cervical ROM by 5-10 deg in all planes     Baseline  see flowsheet    Status  On-going      PT LONG TERM GOAL #3   Title  Patient demonstrates improved posture and alignment with posterior shoulder girdle engaged     Baseline  able to correct but will return to slouch- lacking endurance    Status  On-going      PT LONG TERM GOAL #4   Title  Independent in HEP     Baseline  waiting until I need to    Status  On-going      PT LONG TERM GOAL #5   Title  Improve FOTO to </= 23% limitation    Baseline  44% limited    Status  On-going            Plan - 06/11/18 1216    Clinical Impression Statement  Pt continues to have HA pain consistent with referral patterns  and reduced with manual techniques. she demonstrates good carryover however is still in postures during the day that are less than ideal while holding a baby, breast feeding and working on a computer which we discussed will all slow progress. created an HEP of 3 stretches to do 3/day to be more proactive rather than reactive to pain. ROM decreasing in sidebend limited by upper trap tightness. Sleep is decreasing as sleep habits of baby are changing which we discussed needs to be monitored in conjunction with HA symptoms as well. At this time pt will continue to benefit from skilled PT to continue progressing toward goals by decreasing poor biomechanical chain alignment and creating proper long term program.     PT Frequency  2x / week    PT Duration  4 weeks    PT Treatment/Interventions  Patient/family education;ADLs/Self Care Home Management;Cryotherapy;Electrical Stimulation;Iontophoresis 4mg /ml Dexamethasone;Moist Heat;Ultrasound;Dry needling;Manual techniques;Neuromuscular re-education;Therapeutic activities;Therapeutic exercise    PT Next Visit Plan  DN PRN, lower trap activation, pec stretching    PT Home Exercise Plan  doorway/corner stretch, chin tuck, scap squeeze, horizontal abd red and ER red; open book, hooklying ab set; (rhomboid stretch, door pec stretch, thoracic ext)    Consulted and Agree with Plan of Care  Patient       Patient will benefit from skilled therapeutic intervention in order to improve the following deficits and impairments:  Postural dysfunction, Improper body mechanics, Pain, Increased fascial restricitons, Increased muscle spasms, Decreased mobility, Decreased range of motion, Decreased activity tolerance  Visit Diagnosis: Pain in thoracic spine - Plan: PT plan of care cert/re-cert  Cervicalgia - Plan: PT plan of care cert/re-cert  Acute bilateral low back pain without sciatica - Plan: PT plan of care cert/re-cert  Other symptoms and signs involving the  musculoskeletal system - Plan: PT plan of care cert/re-cert  Abnormal posture - Plan: PT plan of care cert/re-cert     Problem List Patient Active Problem List   Diagnosis Date Noted  . Physical exam  01/09/2018  . SVD (spontaneous vaginal delivery) 12/04/2017  . Postpartum care following vaginal delivery (7/15) 12/04/2017    Cierria Height C. Alianny Toelle PT, DPT 06/11/18 12:25 PM   [facility] [place] [address] [place], [place], [address] Phone: [phone]   Fax:  [phone]  Name: Brenda Freeman MRN: [id number] Date of Birth: 1995-05-31

## 2018-06-13 ENCOUNTER — Ambulatory Visit: Payer: 59 | Admitting: Physical Therapy

## 2018-06-13 ENCOUNTER — Encounter: Payer: Self-pay | Admitting: Physical Therapy

## 2018-06-13 DIAGNOSIS — M546 Pain in thoracic spine: Secondary | ICD-10-CM | POA: Diagnosis not present

## 2018-06-13 DIAGNOSIS — M542 Cervicalgia: Secondary | ICD-10-CM

## 2018-06-13 NOTE — Therapy (Signed)
[place], [place], [address] Phone: [phone]   Fax:  [phone]  Physical Therapy Treatment  Patient Details  Name: Brenda Freeman MRN: [id number] Date of Birth: 07/26/1995 Referring Provider (PT): Dr Birdie Riddle   Encounter Date: 06/13/2018  PT End of Session - 06/13/18 0945    Visit Number  13    Number of Visits  20    Date for PT Re-Evaluation  07/12/18    Authorization Type  UHC    PT Start Time  0945    PT Stop Time  1014    PT Time Calculation (min)  29 min    Activity Tolerance  Patient tolerated treatment well    Behavior During Therapy  [facility] for tasks assessed/performed       Past Medical History:  Diagnosis Date  . Allergy   . Chicken pox   . Frequent headaches     History reviewed. No pertinent surgical history.  There were no vitals filed for this visit.  Subjective Assessment - 06/13/18 0944    Subjective  Not hurting today. Still working on regularity of stretching.     Patient Stated Goals  get rid of the pain in the neck and back; return to normal activities     Currently in Pain?  No/denies                       [facility] Adult PT Treatment/Exercise - 06/13/18 0001      Manual Therapy   Manual therapy comments  skilled palpation and monitoring during TPDN    Joint Mobilization  gross rib ER & thoracic PA    Soft tissue mobilization  suboccipital release, bil temporalis       Trigger Point Dry Needling - 06/13/18 1001    Muscles Treated Upper Body  Rhomboids   temporalis Left   Rhomboids Response  Twitch response elicited;Palpable increased muscle length   Right               PT Long Term Goals - 06/11/18 0949      PT LONG TERM GOAL #1   Title  Decrease pain to 0/10 to 2/10 at most with all functional activities and at rest     Baseline  average about 6/10- trying to keep headaches away    Status  On-going      PT LONG TERM GOAL #2   Title   Increased cervical ROM by 5-10 deg in all planes     Baseline  see flowsheet    Status  On-going      PT LONG TERM GOAL #3   Title  Patient demonstrates improved posture and alignment with posterior shoulder girdle engaged     Baseline  able to correct but will return to slouch- lacking endurance    Status  On-going      PT LONG TERM GOAL #4   Title  Independent in HEP     Baseline  waiting until I need to    Status  On-going      PT LONG TERM GOAL #5   Title  Improve FOTO to </= 23% limitation    Baseline  44% limited    Status  On-going            Plan - 06/13/18 1503    Clinical Impression Statement  concordant temporal pain found in temporalis trigger points and released with DN. twitch response in rhomboids that  improved postural ability.     PT Treatment/Interventions  Patient/family education;ADLs/Self Care Home Management;Cryotherapy;Electrical Stimulation;Iontophoresis 4mg /ml Dexamethasone;Moist Heat;Ultrasound;Dry needling;Manual techniques;Neuromuscular re-education;Therapeutic activities;Therapeutic exercise    PT Next Visit Plan  periscap strengthening    PT Home Exercise Plan  doorway/corner stretch, chin tuck, scap squeeze, horizontal abd red and ER red; open book, hooklying ab set; (rhomboid stretch, door pec stretch, thoracic ext)    Consulted and Agree with Plan of Care  Patient       Patient will benefit from skilled therapeutic intervention in order to improve the following deficits and impairments:  Postural dysfunction, Improper body mechanics, Pain, Increased fascial restricitons, Increased muscle spasms, Decreased mobility, Decreased range of motion, Decreased activity tolerance  Visit Diagnosis: Pain in thoracic spine  Cervicalgia     Problem List Patient Active Problem List   Diagnosis Date Noted  . Physical exam 01/09/2018  . SVD (spontaneous vaginal delivery) 12/04/2017  . Postpartum care following vaginal delivery (7/15) 12/04/2017     Emanuela Runnion C. Mairlyn Tegtmeyer PT, DPT 06/13/18 3:12 PM   [place], [place], [address] Phone: [phone]   Fax:  [phone]  Name: Brenda Freeman MRN: [id number] Date of Birth: Jul 21, 1995

## 2018-06-18 ENCOUNTER — Encounter: Payer: Self-pay | Admitting: Physical Therapy

## 2018-06-18 ENCOUNTER — Ambulatory Visit: Payer: 59 | Admitting: Physical Therapy

## 2018-06-18 DIAGNOSIS — M545 Low back pain, unspecified: Secondary | ICD-10-CM

## 2018-06-18 DIAGNOSIS — M546 Pain in thoracic spine: Secondary | ICD-10-CM | POA: Diagnosis not present

## 2018-06-18 DIAGNOSIS — M542 Cervicalgia: Secondary | ICD-10-CM

## 2018-06-18 DIAGNOSIS — R29898 Other symptoms and signs involving the musculoskeletal system: Secondary | ICD-10-CM

## 2018-06-18 DIAGNOSIS — R293 Abnormal posture: Secondary | ICD-10-CM

## 2018-06-18 NOTE — Therapy (Signed)
[place], [place], [address] Phone: [phone]   Fax:  [phone]  Physical Therapy Treatment  Patient Details  Name: Brenda Freeman MRN: [id number] Date of Birth: [birth year] Referring Provider (PT): Dr Birdie Riddle   Encounter Date: 06/18/2018  PT End of Session - 06/18/18 0940    Visit Number  14    Number of Visits  20    Date for PT Re-Evaluation  07/12/18    Authorization Type  UHC    PT Start Time  0940   pt arrived 10 min late   PT Stop Time  1013    PT Time Calculation (min)  33 min    Activity Tolerance  Patient tolerated treatment well    Behavior During Therapy  [facility] for tasks assessed/performed       Past Medical History:  Diagnosis Date  . Allergy   . Chicken pox   . Frequent headaches     History reviewed. No pertinent surgical history.  There were no vitals filed for this visit.  Subjective Assessment - 06/18/18 0940    Subjective  "I am doing pretty good, The DN has heled with the pressure, I've been consistent with the exercises that was given to me previously"     Patient Stated Goals  get rid of the pain in the neck and back; return to normal activities     Currently in Pain?  No/denies    Pain Score  0-No pain         [facility] PT Assessment - 06/18/18 0001      Assessment   Medical Diagnosis  Cervical dysfunction; LBP                    [facility] Adult PT Treatment/Exercise - 06/18/18 0001      Self-Care   Self-Care  Other Self-Care Comments    Other Self-Care Comments   how to perform manual trigger point release using tennis balls or tools and where she can find the tools      Neck Exercises: Machines for Strengthening   UBE (Upper Arm Bike)  L1 x 4 min    changing direciton at 2 min      Shoulder Exercises: Standing   Row  Strengthening;Both;10 reps;Theraband    Theraband Level (Shoulder Row)  Level 3 (Green)    Other Standing Exercises  wall push up with a  plus 2 x 10   verbal cues for proper form   Other Standing Exercises  lower trap wall Y's with lift off 1 x 10       Manual Therapy   Manual therapy comments  MTPR along the R upper trap/ levator scapulae      Neck Exercises: Stretches   Upper Trapezius Stretch  2 reps;30 seconds    Levator Stretch  2 reps;30 seconds    Other Neck Stretches  rhomboid stretch cross arm (using free motion) 2 x 30 sec             PT Education - 06/18/18 1015    Education Details  MTPR techniques    Person(s) Educated  Patient    Methods  Explanation;Verbal cues    Comprehension  Verbalized understanding;Verbal cues required          PT Long Term Goals - 06/11/18 0949      PT LONG TERM GOAL #1   Title  Decrease pain to 0/10 to 2/10 at most with all functional  activities and at rest     Baseline  average about 6/10- trying to keep headaches away    Status  On-going      PT LONG TERM GOAL #2   Title  Increased cervical ROM by 5-10 deg in all planes     Baseline  see flowsheet    Status  On-going      PT LONG TERM GOAL #3   Title  Patient demonstrates improved posture and alignment with posterior shoulder girdle engaged     Baseline  able to correct but will return to slouch- lacking endurance    Status  On-going      PT LONG TERM GOAL #4   Title  Independent in HEP     Baseline  waiting until I need to    Status  On-going      PT LONG TERM GOAL #5   Title  Improve FOTO to </= 23% limitation    Baseline  44% limited    Status  On-going            Plan - 06/18/18 1014    Clinical Impression Statement  pt arrived 10 min late today. continued working on stretching and shoulder strengthening. since pt was doing better opted to perform MTPR techniques which she reported relief with. no pain noted following session.     PT Next Visit Plan  periscap strengthening, DN PRN    PT Home Exercise Plan  doorway/corner stretch, chin tuck, scap squeeze, horizontal abd red and ER red; open  book, hooklying ab set; (rhomboid stretch, door pec stretch, thoracic ext)    Consulted and Agree with Plan of Care  Patient       Patient will benefit from skilled therapeutic intervention in order to improve the following deficits and impairments:  Postural dysfunction, Improper body mechanics, Pain, Increased fascial restricitons, Increased muscle spasms, Decreased mobility, Decreased range of motion, Decreased activity tolerance  Visit Diagnosis: Pain in thoracic spine  Cervicalgia  Acute bilateral low back pain without sciatica  Other symptoms and signs involving the musculoskeletal system  Abnormal posture     Problem List Patient Active Problem List   Diagnosis Date Noted  . Physical exam 01/09/2018  . SVD (spontaneous vaginal delivery) 12/04/2017  . Postpartum care following vaginal delivery (7/15) 12/04/2017   Starr Lake PT, DPT, LAT, ATC  06/18/18  10:16 AM      [place], [place], [address] Phone: [phone]   Fax:  [phone]  Name: Brenda Freeman MRN: [id number] Date of Birth: 1995/10/18

## 2018-06-20 ENCOUNTER — Ambulatory Visit: Payer: 59 | Admitting: Physical Therapy

## 2018-06-20 ENCOUNTER — Encounter: Payer: Self-pay | Admitting: Physical Therapy

## 2018-06-20 DIAGNOSIS — M545 Low back pain, unspecified: Secondary | ICD-10-CM

## 2018-06-20 DIAGNOSIS — M546 Pain in thoracic spine: Secondary | ICD-10-CM | POA: Diagnosis not present

## 2018-06-20 DIAGNOSIS — M542 Cervicalgia: Secondary | ICD-10-CM

## 2018-06-20 DIAGNOSIS — R29898 Other symptoms and signs involving the musculoskeletal system: Secondary | ICD-10-CM

## 2018-06-20 DIAGNOSIS — R293 Abnormal posture: Secondary | ICD-10-CM

## 2018-06-20 NOTE — Therapy (Signed)
[place], [place], [address] Phone: [phone]   Fax:  [phone]  Physical Therapy Treatment  Patient Details  Name: Brenda Freeman MRN: [id number] Date of Birth: Apr 05, 1996 Referring Provider (PT): Dr Birdie Riddle   Encounter Date: 06/20/2018  PT End of Session - 06/20/18 0941    Visit Number  15    Number of Visits  20    Date for PT Re-Evaluation  07/12/18    Authorization Type  UHC    PT Start Time  [id number]   pt arrived 11 min late today   PT Stop Time  1014    PT Time Calculation (min)  33 min    Activity Tolerance  Patient tolerated treatment well    Behavior During Therapy  [facility] for tasks assessed/performed       Past Medical History:  Diagnosis Date  . Allergy   . Chicken pox   . Frequent headaches     History reviewed. No pertinent surgical history.  There were no vitals filed for this visit.  Subjective Assessment - 06/20/18 0942    Subjective  "I had a HA this AM that radiated to the temple, I didn't do anything and it did go away on on its own"    Patient Stated Goals  get rid of the pain in the neck and back; return to normal activities     Currently in Pain?  Yes    Pain Score  8     Pain Orientation  Right    Pain Descriptors / Indicators  --   headache   Pain Type  Chronic pain    Pain Onset  More than a month ago    Pain Frequency  Intermittent                       [facility] Adult PT Treatment/Exercise - 06/20/18 0001      Neck Exercises: Seated   Neck Retraction  10 reps;5 secs   cues for proper form   Money  10 reps   with red theraband   Other Seated Exercise  row 2 x 10 with red theraband      Manual Therapy   Manual therapy comments  skilled palpation and monitoring during TPDN    Joint Mobilization  Lfirst rib mobs grade III      Neck Exercises: Stretches   Upper Trapezius Stretch  30 seconds;1 rep;Left    Levator Stretch  30 seconds;1 rep;Left    Other Neck Stretches  SCM stretch 2 x 30 sec   L only      Trigger Point Dry Needling - 06/20/18 1003    Muscles Treated Upper Body  Sternocleidomastoid   L temporalis   Levator Scapulae Response  Twitch response elicited;Palpable increased muscle length    Subscapularis Response  Twitch response elicited;Palpable increased muscle length           PT Education - 06/20/18 1017    Education Details  sleeping posture to promote neutral spinal alignment    Person(s) Educated  Patient    Methods  Explanation;Verbal cues    Comprehension  Verbalized understanding;Verbal cues required          PT Long Term Goals - 06/11/18 0949      PT LONG TERM GOAL #1   Title  Decrease pain to 0/10 to 2/10 at most with all functional activities and at rest     Baseline  average about 6/10- trying to keep headaches away    Status  On-going      PT LONG TERM GOAL #2   Title  Increased cervical ROM by 5-10 deg in all planes     Baseline  see flowsheet    Status  On-going      PT LONG TERM GOAL #3   Title  Patient demonstrates improved posture and alignment with posterior shoulder girdle engaged     Baseline  able to correct but will return to slouch- lacking endurance    Status  On-going      PT LONG TERM GOAL #4   Title  Independent in HEP     Baseline  waiting until I need to    Status  On-going      PT LONG TERM GOAL #5   Title  Improve FOTO to </= 23% limitation    Baseline  44% limited    Status  On-going            Plan - 06/20/18 1014    Clinical Impression Statement  pt was 11 min late today. She reported having a HA this morning that went away on it's on but reported 8/10 due to feeling like she is going to get a HA. cotninued TPDN focusing on temporalis, upper trap and SCM on the L followed with IASTM techniques. continued stretching and scapular strengthening to promote posture. discussed sleeping positions to reduce tension on the neck since she reports waking up  with HA. she noted soreness following session from DN.     PT Next Visit Plan  periscap strengthening, DN PRN, sleeping posture    PT Home Exercise Plan  doorway/corner stretch, chin tuck, scap squeeze, horizontal abd red and ER red; open book, hooklying ab set; (rhomboid stretch, door pec stretch, thoracic ext)    Consulted and Agree with Plan of Care  Patient       Patient will benefit from skilled therapeutic intervention in order to improve the following deficits and impairments:     Visit Diagnosis: Pain in thoracic spine  Cervicalgia  Acute bilateral low back pain without sciatica  Other symptoms and signs involving the musculoskeletal system  Abnormal posture     Problem List Patient Active Problem List   Diagnosis Date Noted  . Physical exam 01/09/2018  . SVD (spontaneous vaginal delivery) 12/04/2017  . Postpartum care following vaginal delivery (7/15) 12/04/2017   Starr Lake PT, DPT, LAT, ATC  06/20/18  10:19 AM      [place], [place], [address] Phone: [phone]   Fax:  [phone]  Name: Brenda Freeman MRN: [id number] Date of Birth: 06-05-1995

## 2018-06-25 ENCOUNTER — Encounter: Payer: 59 | Admitting: Physical Therapy

## 2018-06-28 ENCOUNTER — Ambulatory Visit: Payer: 59 | Admitting: Physical Therapy

## 2018-07-02 ENCOUNTER — Encounter: Payer: Self-pay | Admitting: Physical Therapy

## 2018-07-02 ENCOUNTER — Ambulatory Visit: Payer: 59 | Attending: Family Medicine | Admitting: Physical Therapy

## 2018-07-02 DIAGNOSIS — R29898 Other symptoms and signs involving the musculoskeletal system: Secondary | ICD-10-CM | POA: Diagnosis present

## 2018-07-02 DIAGNOSIS — M542 Cervicalgia: Secondary | ICD-10-CM | POA: Insufficient documentation

## 2018-07-02 DIAGNOSIS — R293 Abnormal posture: Secondary | ICD-10-CM | POA: Diagnosis present

## 2018-07-02 DIAGNOSIS — M545 Low back pain, unspecified: Secondary | ICD-10-CM

## 2018-07-02 DIAGNOSIS — M546 Pain in thoracic spine: Secondary | ICD-10-CM
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
## 2018-07-04 ENCOUNTER — Encounter: Payer: Self-pay | Admitting: Family Medicine

## 2018-07-04 ENCOUNTER — Other Ambulatory Visit: Payer: Self-pay

## 2018-07-04 ENCOUNTER — Ambulatory Visit (INDEPENDENT_AMBULATORY_CARE_PROVIDER_SITE_OTHER): Payer: 59 | Admitting: Family Medicine

## 2018-07-04 ENCOUNTER — Ambulatory Visit: Payer: 59 | Admitting: Physical Therapy

## 2018-07-04 ENCOUNTER — Encounter: Payer: Self-pay | Admitting: Physical Therapy

## 2018-07-04 VITALS — BP 110/64 | HR 99 | Temp 98.0°F | Resp 14 | Ht 64.0 in | Wt 126.0 lb

## 2018-07-04 DIAGNOSIS — M546 Pain in thoracic spine: Secondary | ICD-10-CM

## 2018-07-04 DIAGNOSIS — R51 Headache: Secondary | ICD-10-CM | POA: Diagnosis not present

## 2018-07-04 DIAGNOSIS — M5412 Radiculopathy, cervical region: Secondary | ICD-10-CM

## 2018-07-04 DIAGNOSIS — M62838 Other muscle spasm: Secondary | ICD-10-CM

## 2018-07-04 DIAGNOSIS — M542 Cervicalgia: Secondary | ICD-10-CM

## 2018-07-04 DIAGNOSIS — J301 Allergic rhinitis due to pollen: Secondary | ICD-10-CM

## 2018-07-04 DIAGNOSIS — R519 Headache, unspecified: Secondary | ICD-10-CM

## 2018-07-04 MED ORDER — NAPROXEN 500 MG PO TABS: 500.0000 mg | ORAL_TABLET | Freq: Two times a day (BID) | ORAL | 0 refills | Status: DC

## 2018-07-04 MED ORDER — CETIRIZINE HCL 10 MG PO TABS: 10.0000 mg | ORAL_TABLET | Freq: Every day | ORAL | 11 refills | Status: DC
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
## 2018-07-04 NOTE — Assessment & Plan Note (Signed)
New.  Pt's PE consistent w/ untreated allergic rhinitis.  This could be contributing to her headaches.  Start daily Zyrtec to improve congestion and sinus pressure.  Pt expressed understanding and is in agreement w/ plan.

## 2018-07-04 NOTE — Patient Instructions (Addendum)
Follow up as needed or as scheduled We'll call you with your Orthopedic appt START the twice daily Naproxen for pain and inflammation.  Take w/ food.  This is compatible w/ breast feeding HEAT Please schedule an eye exam START the once daily Zyrtec to improve sinus congestion and hopefully headache Call with any questions or concerns Hang in there!!

## 2018-07-04 NOTE — Therapy (Signed)
[place], [place], [address] Phone: [phone]   Fax:  [phone]  Physical Therapy Treatment  Patient Details  Name: Brenda Freeman MRN: [id number] Date of Birth: [birth year] Referring Provider (PT): Dr Birdie Riddle   Encounter Date: 07/04/2018  PT End of Session - 07/04/18 1636    Visit Number  17    Number of Visits  20    Date for PT Re-Evaluation  07/12/18    Authorization Type  UHC    PT Start Time  1636   pt arrived late   PT Stop Time  1710    PT Time Calculation (min)  34 min    Activity Tolerance  Patient tolerated treatment well    Behavior During Therapy  [facility] for tasks assessed/performed       Past Medical History:  Diagnosis Date  . Allergy   . Chicken pox   . Frequent headaches     History reviewed. No pertinent surgical history.  There were no vitals filed for this visit.  Subjective Assessment - 07/04/18 1636    Subjective  Being referred to ortho for cervical evauation. taking zyrtec for sinuses which may be playingpart in HA.     Patient Stated Goals  get rid of the pain in the neck and back; return to normal activities     Currently in Pain?  Yes    Pain Score  5     Pain Location  Neck    Pain Orientation  Left    Pain Descriptors / Indicators  --   uncomfortable   Aggravating Factors   computer, feeding baby    Pain Relieving Factors  posture correction, stretches                       [facility] Adult PT Treatment/Exercise - 07/04/18 0001      Neck Exercises: Machines for Strengthening   UBE (Upper Arm Bike)  retro 3 min L1      Neck Exercises: Standing   Other Standing Exercises  row blue tband    Other Standing Exercises  GHJ ext red tband; horiz abd red tband at wall      Manual Therapy   Manual therapy comments  skilled palpation and monitoring during TPDN    Joint Mobilization  thoracic PA joint mobs    Soft tissue mobilization  IASTM Lt thoracic  paraspinals & periscap musculature       Trigger Point Dry Needling - 07/04/18 1655    Muscles Treated Upper Body  --   Lt T6 parapsinal   Rhomboids Response  Twitch response elicited;Palpable increased muscle length                PT Long Term Goals - 06/11/18 0949      PT LONG TERM GOAL #1   Title  Decrease pain to 0/10 to 2/10 at most with all functional activities and at rest     Baseline  average about 6/10- trying to keep headaches away    Status  On-going      PT LONG TERM GOAL #2   Title  Increased cervical ROM by 5-10 deg in all planes     Baseline  see flowsheet    Status  On-going      PT LONG TERM GOAL #3   Title  Patient demonstrates improved posture and alignment with posterior shoulder girdle engaged     Baseline  able to  correct but will return to slouch- lacking endurance    Status  On-going      PT LONG TERM GOAL #4   Title  Independent in HEP     Baseline  waiting until I need to    Status  On-going      PT LONG TERM GOAL #5   Title  Improve FOTO to </= 23% limitation    Baseline  44% limited    Status  On-going            Plan - 07/04/18 1712    Clinical Impression Statement  DN effective to reduce periscapular pain today, soreness reported during exercises following as expected. bil winging still noted  but pt is able to demo correction with verbal cues, lacking endurance to hold proper posture through daily activities.     PT Treatment/Interventions  Patient/family education;ADLs/Self Care Home Management;Cryotherapy;Electrical Stimulation;Iontophoresis 4mg /ml Dexamethasone;Moist Heat;Ultrasound;Dry needling;Manual techniques;Neuromuscular re-education;Therapeutic activities;Therapeutic exercise    PT Next Visit Plan  periscap endurance, planks+    PT Home Exercise Plan  doorway/corner stretch, chin tuck, scap squeeze, horizontal abd red and ER red; open book, hooklying ab set; (rhomboid stretch, door pec stretch, thoracic ext), horiz  abd red tband    Consulted and Agree with Plan of Care  Patient       Patient will benefit from skilled therapeutic intervention in order to improve the following deficits and impairments:  Postural dysfunction, Improper body mechanics, Pain, Increased fascial restricitons, Increased muscle spasms, Decreased mobility, Decreased range of motion, Decreased activity tolerance  Visit Diagnosis: Pain in thoracic spine  Cervicalgia     Problem List Patient Active Problem List   Diagnosis Date Noted  . Physical exam 01/09/2018  . SVD (spontaneous vaginal delivery) 12/04/2017  . Postpartum care following vaginal delivery (7/15) 12/04/2017  . Allergic rhinitis 06/17/2014  Caroline Longie C. Alora Gorey PT, DPT 07/04/18 5:15 PM   [place], [place], [address] Phone: [phone]   Fax:  [phone]  Name: Brenda Freeman MRN: [id number] Date of Birth: 1995/11/29

## 2018-07-09 ENCOUNTER — Ambulatory Visit: Payer: 59 | Admitting: Physical Therapy

## 2018-07-09 ENCOUNTER — Encounter: Payer: Self-pay | Admitting: Physical Therapy

## 2018-07-09 DIAGNOSIS — M546 Pain in thoracic spine: Secondary | ICD-10-CM | POA: Diagnosis not present

## 2018-07-09 DIAGNOSIS — R293 Abnormal posture: Secondary | ICD-10-CM

## 2018-07-09 DIAGNOSIS — R29898 Other symptoms and signs involving the musculoskeletal system: Secondary | ICD-10-CM

## 2018-07-09 DIAGNOSIS — M545 Low back pain, unspecified: Secondary | ICD-10-CM

## 2018-07-09 DIAGNOSIS — M542 Cervicalgia: Secondary | ICD-10-CM

## 2018-07-09 NOTE — Therapy (Signed)
[place], [place], [address] Phone: [phone]   Fax:  [phone]  Physical Therapy Treatment  Patient Details  Name: Brenda Freeman MRN: [id number] Date of Birth: [birth year] Referring Provider (PT): Dr Birdie Riddle   Encounter Date: 07/09/2018  PT End of Session - 07/09/18 2134    Visit Number  18    Number of Visits  20    Date for PT Re-Evaluation  07/12/18    Authorization Type  UHC    PT Start Time  1728   pt arrived late   PT Stop Time  1800    PT Time Calculation (min)  32 min    Activity Tolerance  Patient tolerated treatment well    Behavior During Therapy  [facility] for tasks assessed/performed       Past Medical History:  Diagnosis Date  . Allergy   . Chicken pox   . Frequent headaches     History reviewed. No pertinent surgical history.  There were no vitals filed for this visit.  Subjective Assessment - 07/09/18 1733    Subjective  colored picture for pain: Bil upper traps Lt>Rt, C7 spinous process, subocccipitals and pain at cervical midline; HA bridge of nose, coronal aspect of head crossing midline, Lt side temporal region. Right now has a mild HA. HA on 2/11 and 2/16 began while in car. HA not affected by meds for sinuses. Massage provided mild relief.     Patient Stated Goals  get rid of the pain in the neck and back; return to normal activities     Currently in Pain?  Yes    Pain Score  4     Pain Location  Shoulder    Pain Orientation  Left    Pain Descriptors / Indicators  Discomfort    Aggravating Factors   consistent    Pain Relieving Factors  DN                                    PT Long Term Goals - 06/11/18 0949      PT LONG TERM GOAL #1   Title  Decrease pain to 0/10 to 2/10 at most with all functional activities and at rest     Baseline  average about 6/10- trying to keep headaches away    Status  On-going      PT LONG TERM GOAL #2   Title   Increased cervical ROM by 5-10 deg in all planes     Baseline  see flowsheet    Status  On-going      PT LONG TERM GOAL #3   Title  Patient demonstrates improved posture and alignment with posterior shoulder girdle engaged     Baseline  able to correct but will return to slouch- lacking endurance    Status  On-going      PT LONG TERM GOAL #4   Title  Independent in HEP     Baseline  waiting until I need to    Status  On-going      PT LONG TERM GOAL #5   Title  Improve FOTO to </= 23% limitation    Baseline  44% limited    Status  On-going            Plan - 07/09/18 1810    Clinical Impression Statement  entirety of session spent in education and discussion.  pt colored on pictures of body to show where pain has been and where HA are concentrated (see subjective). At this point being in a car seems to increase pain so I asked her to be aware of any stress/anxiety while in a car. Discussed PPD/anxiety and provided her with contact info for specialist in this area.     PT Treatment/Interventions  Patient/family education;ADLs/Self Care Home Management;Cryotherapy;Electrical Stimulation;Iontophoresis 4mg /ml Dexamethasone;Moist Heat;Ultrasound;Dry needling;Manual techniques;Neuromuscular re-education;Therapeutic activities;Therapeutic exercise    PT Next Visit Plan  d/c    PT Home Exercise Plan  doorway/corner stretch, chin tuck, scap squeeze, horizontal abd red and ER red; open book, hooklying ab set; (rhomboid stretch, door pec stretch, thoracic ext), horiz abd red tband    Consulted and Agree with Plan of Care  Patient       Patient will benefit from skilled therapeutic intervention in order to improve the following deficits and impairments:  Postural dysfunction, Improper body mechanics, Pain, Increased fascial restricitons, Increased muscle spasms, Decreased mobility, Decreased range of motion, Decreased activity tolerance  Visit Diagnosis: Pain in thoracic  spine  Cervicalgia  Acute bilateral low back pain without sciatica  Other symptoms and signs involving the musculoskeletal system  Abnormal posture     Problem List Patient Active Problem List   Diagnosis Date Noted  . Physical exam 01/09/2018  . SVD (spontaneous vaginal delivery) 12/04/2017  . Postpartum care following vaginal delivery (7/15) 12/04/2017  . Allergic rhinitis 06/17/2014    Brenda Freeman C. Sherri Mcarthy PT, DPT 07/09/18 9:38 PM   [name] [address] [place], [place], [address] Phone: [phone]   Fax:  [phone]  Name: Brenda Freeman MRN: [id number] Date of Birth: [birth year]

## 2018-07-11 ENCOUNTER — Ambulatory Visit: Payer: 59 | Admitting: Physical Therapy

## 2018-07-16 ENCOUNTER — Encounter: Payer: Self-pay | Admitting: Physical Therapy

## 2018-07-16 ENCOUNTER — Ambulatory Visit: Payer: 59 | Admitting: Physical Therapy

## 2018-07-16 DIAGNOSIS — M542 Cervicalgia: Secondary | ICD-10-CM

## 2018-07-16 DIAGNOSIS — M546 Pain in thoracic spine: Secondary | ICD-10-CM | POA: Diagnosis not present

## 2018-07-16 NOTE — Therapy (Signed)
[place], [place], [address] Phone: [phone]   Fax:  [phone]  Physical Therapy Treatment/ERO  Patient Details  Name: Brenda Freeman MRN: [id number] Date of Birth: 10-Dec-1995 Referring Provider (PT): Dr Birdie Riddle   Encounter Date: 07/16/2018  PT End of Session - 07/16/18 2126    Visit Number  19    Number of Visits  27    Date for PT Re-Evaluation  08/18/18    Authorization Type  UHC    PT Start Time  7253    PT Stop Time  1758    PT Time Calculation (min)  35 min    Activity Tolerance  Patient tolerated treatment well    Behavior During Therapy  [facility] for tasks assessed/performed       Past Medical History:  Diagnosis Date  . Allergy   . Chicken pox   . Frequent headaches     History reviewed. No pertinent surgical history.  There were no vitals filed for this visit.  Subjective Assessment - 07/16/18 1725    Subjective  Saw ortho PA-C and reports they ordered further PT, will have notes sent from primary. Really focused when driving, when in passenger seat feels that she has visions of cars running into [place]. Most discomfort right on bone (C6). Taking allergy medications but does not see changes in HA.     Pertinent History  has 26 mo old- breast feeding    Diagnostic tests  xrays-clear    Patient Stated Goals  get rid of the pain in the neck and back; return to normal activities     Currently in Pain?  Yes    Pain Score  3     Pain Location  Head   temporal   Pain Orientation  Left    Pain Descriptors / Indicators  Headache    Aggravating Factors   unknown    Pain Relieving Factors  dry needling, posture    Pain Score  0    Pain Location  Neck         [facility] PT Assessment - 07/16/18 0001      Assessment   Medical Diagnosis  Cervical dysfunction; LBP     Referring Provider (PT)  Dr Birdie Riddle    Onset Date/Surgical Date  03/24/18    Hand Dominance  Right      Precautions   Precautions   None      Restrictions   Weight Bearing Restrictions  No      Balance Screen   Has the patient fallen in the past 6 months  No      [place] residence      Prior Function   Level of Independence  Independent    Vocation  Full time employment    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      Cognition   Overall Cognitive Status  Within Functional Limits for tasks assessed      Observation/Other Assessments   Focus on Therapeutic Outcomes (FOTO)   35% limited      Sensation   Additional Comments  Odd sensation in Left arm last night when I was carrying some clothes      Posture/Postural Control   Posture Comments  rounded shoulders with forward head- does correct independently but lacking endurance to hold      AROM   Cervical Flexion  44   stretching at midline  Cervical - Right Side Bend  32    Cervical - Left Side Bend  38   Rt side pulling   Cervical - Right Rotation  60    Cervical - Left Rotation  60      Special Tests   Other special tests  visual tracking- no HA but was seeing spots after                   [facility] Adult PT Treatment/Exercise - 07/16/18 0001      Manual Therapy   Joint Mobilization  C7 Rt PA & Rt rotation  Gr 4   decreased stretch following   Myofascial Release  Rt scalenes             PT Education - 07/16/18 2126    Education Details  POC, HEP, anatomy of condition, goals discussion, FOTO    Person(s) Educated  Patient    Methods  Explanation    Comprehension  Verbalized understanding;Need further instruction          PT Long Term Goals - 07/16/18 1732      PT LONG TERM GOAL #1   Title  Decrease pain to 0/10 to 2/10 at most with all functional activities and at rest     Baseline  driving creates significant pain at C6    Status  On-going      PT LONG TERM GOAL #2   Title  Increased cervical ROM by 5-10 deg in all planes     Baseline  see flowsheet    Status   Achieved      PT LONG TERM GOAL #3   Title  Patient demonstrates improved posture and alignment with posterior shoulder girdle engaged     Baseline  able to correct but will return to slouch- lacking endurance, requires reminders    Status  On-going      PT LONG TERM GOAL #4   Title  Independent in HEP     Baseline  do it as needed but not as consistent as it should be    Status  On-going      PT LONG TERM GOAL #5   Title  Improve FOTO to </= 23% limitation    Baseline  35% limited    Status  On-going            Plan - 07/16/18 1753    Clinical Impression Statement  Pt has made improvements in pain and severity of symptoms since beginning PT but continues to have pain at C6 & C7 at midline with tightness in Rt & Lt cervical musculature as well as headaches. Noted rotation of C6 with tightness of scalenes on Rt that recreated chest pain and trigger point in SCM that recreated similar HA pain. Mobilized and did STM to Rt scalenes and will address with DN at next visit. Pt went to ortho but was unable to recall any diagnosis and reports she will request notes from primary physician. Pt will continue to benefit from skilled PT in order to meet long term functional goals and decrease pain.     History and Personal Factors relevant to plan of care:  breast feeding, works on a computer every day    PT Frequency  2x / week    PT Duration  4 weeks    PT Treatment/Interventions  Patient/family education;ADLs/Self Care Home Management;Cryotherapy;Electrical Stimulation;Iontophoresis 4mg /ml Dexamethasone;Moist Heat;Ultrasound;Dry needling;Manual techniques;Neuromuscular re-education;Therapeutic activities;Therapeutic exercise    PT Next Visit Plan  C6/7  mobs, first rib mobs, scalene DN, lower cervical paraspinals DN, LT SCM DN    PT Home Exercise Plan  doorway/corner stretch, chin tuck, scap squeeze, horizontal abd red and ER red; open book, hooklying ab set; (rhomboid stretch, door pec stretch,  thoracic ext), horiz abd red tband    Consulted and Agree with Plan of Care  Patient       Patient will benefit from skilled therapeutic intervention in order to improve the following deficits and impairments:  Postural dysfunction, Improper body mechanics, Pain, Increased fascial restricitons, Increased muscle spasms, Decreased mobility, Decreased range of motion, Decreased activity tolerance  Visit Diagnosis: Pain in thoracic spine - Plan: PT plan of care cert/re-cert  Cervicalgia - Plan: PT plan of care cert/re-cert     Problem List Patient Active Problem List   Diagnosis Date Noted  . Physical exam 01/09/2018  . SVD (spontaneous vaginal delivery) 12/04/2017  . Postpartum care following vaginal delivery (7/15) 12/04/2017  . Allergic rhinitis 06/17/2014   Sharifa Bucholz C. Shaquon Gropp PT, DPT 07/16/18 9:34 PM   [place] [facility] [address] [place], [place], [address] Phone: [phone]   Fax:  [phone]  Name: Richa Shor MRN: [id number] Date of Birth: 12-Oct-1995

## 2018-07-17 ENCOUNTER — Encounter: Payer: Self-pay | Admitting: General Practice

## 2018-07-17 ENCOUNTER — Telehealth: Payer: Self-pay

## 2018-07-17 NOTE — Telephone Encounter (Signed)
Called pt and could not leave a message. She should contact ortho and have them forward the notes to PT. We do not have access to her full chart.   Mychart message also sent to pt.

## 2018-07-17 NOTE — Telephone Encounter (Signed)
Copied from [place] [id number]. Topic: Medical Record Request - Patient ROI Request >> Jul 17, 2018  2:04 PM Burchel, Abbi R wrote: Pt requesting that notes from [facility] be forwarded to PT office.

## 2018-07-18 ENCOUNTER — Ambulatory Visit: Payer: 59 | Admitting: Physical Therapy

## 2018-07-18 ENCOUNTER — Encounter: Payer: Self-pay | Admitting: Physical Therapy

## 2018-07-18 DIAGNOSIS — M546 Pain in thoracic spine: Secondary | ICD-10-CM

## 2018-07-18 DIAGNOSIS — M545 Low back pain, unspecified: Secondary | ICD-10-CM

## 2018-07-18 DIAGNOSIS — M542 Cervicalgia: Secondary | ICD-10-CM

## 2018-07-18 DIAGNOSIS — R293 Abnormal posture: Secondary | ICD-10-CM

## 2018-07-18 DIAGNOSIS — R29898 Other symptoms and signs involving the musculoskeletal system: Secondary | ICD-10-CM

## 2018-07-18 NOTE — Telephone Encounter (Signed)
Copied from [place] [id number]. Topic: Medical Record Request - Patient ROI Request >> Jul 17, 2018  2:04 PM Burchel, Abbi R wrote: Pt requesting that notes from [facility] be forwarded to PT office.

## 2018-07-18 NOTE — Therapy (Signed)
[place], [place], [address] Phone: [phone]   Fax:  [phone]  Physical Therapy Treatment  Patient Details  Name: Brenda Freeman MRN: [id number] Date of Birth: 10-Sep-1995 Referring Provider (PT): Dr Birdie Riddle   Encounter Date: 07/18/2018  PT End of Session - 07/18/18 1816    Visit Number  20    Number of Visits  27    Date for PT Re-Evaluation  08/18/18    Authorization Type  UHC    PT Start Time  1725   pt arrived late   PT Stop Time  1800    PT Time Calculation (min)  35 min    Activity Tolerance  Patient tolerated treatment well    Behavior During Therapy  [facility] for tasks assessed/performed       Past Medical History:  Diagnosis Date  . Allergy   . Chicken pox   . Frequent headaches     History reviewed. No pertinent surgical history.  There were no vitals filed for this visit.  Subjective Assessment - 07/18/18 1728    Subjective  I don't think I have been feeling my Left temporal region lately. Less discomfort bending forward.     Patient Stated Goals  get rid of the pain in the neck and back; return to normal activities     Currently in Pain?  No/denies                       [facility] Adult PT Treatment/Exercise - 07/18/18 0001      Manual Therapy   Manual therapy comments  skilled palpation and monitoring during TPDN    Joint Mobilization  C5/6/7 PA & Rt lateral PA; gross thoracic PA gr 4 with multiple cavitations.; C5/6 Lt lateral glides in end range Rt sidebend    Soft tissue mobilization  Lt upper trap, suboccipital release    Manual Traction  sustained 5x1 min       Trigger Point Dry Needling - 07/18/18 1749    Muscles Treated Upper Body  --   Rt cervical paraspinals C5, C6   Upper Trapezius Response  Twitch reponse elicited;Palpable increased muscle length   Right          PT Education - 07/18/18 1815    Education Details  rationale for manual therapy,  mechanical vs manual traction, need for exercises to support improvements    Person(s) Educated  Patient    Methods  Explanation    Comprehension  Verbalized understanding;Need further instruction          PT Long Term Goals - 07/16/18 1732      PT LONG TERM GOAL #1   Title  Decrease pain to 0/10 to 2/10 at most with all functional activities and at rest     Baseline  driving creates significant pain at C6    Status  On-going      PT LONG TERM GOAL #2   Title  Increased cervical ROM by 5-10 deg in all planes     Baseline  see flowsheet    Status  Achieved      PT LONG TERM GOAL #3   Title  Patient demonstrates improved posture and alignment with posterior shoulder girdle engaged     Baseline  able to correct but will return to slouch- lacking endurance, requires reminders    Status  On-going      PT LONG TERM GOAL #4   Title  Independent in HEP     Baseline  do it as needed but not as consistent as it should be    Status  On-going      PT LONG TERM GOAL #5   Title  Improve FOTO to </= 23% limitation    Baseline  35% limited    Status  On-going            Plan - 07/18/18 1816    Clinical Impression Statement  Decreased tightness overall in cervical and thoracic musculature. continued rotation of lower cervical vertebrae addressed with manual therapy and multifidus trigger point release with DN. Was afraid that HA was returning after DN but denied any pain other than soreness following treatment. Encouraged her to keep moving around and take a hot shower before bed.     PT Treatment/Interventions  Patient/family education;ADLs/Self Care Home Management;Cryotherapy;Electrical Stimulation;Iontophoresis 4mg /ml Dexamethasone;Moist Heat;Ultrasound;Dry needling;Manual techniques;Neuromuscular re-education;Therapeutic activities;Therapeutic exercise    PT Next Visit Plan  manual therapy PRN, check scalene and Lt SCM, postural exercises    PT Home Exercise Plan  doorway/corner  stretch, chin tuck, scap squeeze, horizontal abd red and ER red; open book, hooklying ab set; (rhomboid stretch, door pec stretch, thoracic ext), horiz abd red tband    Consulted and Agree with Plan of Care  Patient       Patient will benefit from skilled therapeutic intervention in order to improve the following deficits and impairments:  Postural dysfunction, Improper body mechanics, Pain, Increased fascial restricitons, Increased muscle spasms, Decreased mobility, Decreased range of motion, Decreased activity tolerance  Visit Diagnosis: Pain in thoracic spine  Cervicalgia  Acute bilateral low back pain without sciatica  Other symptoms and signs involving the musculoskeletal system  Abnormal posture     Problem List Patient Active Problem List   Diagnosis Date Noted  . Physical exam 01/09/2018  . SVD (spontaneous vaginal delivery) 12/04/2017  . Postpartum care following vaginal delivery (7/15) 12/04/2017  . Allergic rhinitis 06/17/2014    Kirandeep Fariss C. Fayrene Towner PT, DPT 07/18/18 6:19 PM   [place] [facility] [address] [place], [place], [address] Phone: [phone]   Fax:  [phone]  Name: Acasia Skilton MRN: [id number] Date of Birth: 12/31/1995

## 2018-07-25 ENCOUNTER — Ambulatory Visit: Payer: 59 | Attending: Family Medicine | Admitting: Physical Therapy

## 2018-07-25 DIAGNOSIS — R293 Abnormal posture: Secondary | ICD-10-CM | POA: Diagnosis present

## 2018-07-25 DIAGNOSIS — M545 Low back pain, unspecified: Secondary | ICD-10-CM

## 2018-07-25 DIAGNOSIS — R29898 Other symptoms and signs involving the musculoskeletal system: Secondary | ICD-10-CM

## 2018-07-25 DIAGNOSIS — M542 Cervicalgia: Secondary | ICD-10-CM | POA: Insufficient documentation

## 2018-07-25 DIAGNOSIS — M546 Pain in thoracic spine: Secondary | ICD-10-CM | POA: Diagnosis not present

## 2018-07-25 NOTE — Therapy (Signed)
[place], [place], [address] Phone: [phone]   Fax:  [phone]  Physical Therapy Treatment  Patient Details  Name: Brenda Freeman MRN: [id number] Date of Birth: [birth year] Referring Provider (PT): Dr Birdie Riddle   Encounter Date: 07/25/2018  PT End of Session - 07/25/18 0903    Visit Number  21    Number of Visits  27    Date for PT Re-Evaluation  08/18/18    Authorization Type  UHC    PT Start Time  0901   came 15 min late   PT Stop Time  0945    PT Time Calculation (min)  44 min    Activity Tolerance  Patient tolerated treatment well    Behavior During Therapy  [facility] for tasks assessed/performed       Past Medical History:  Diagnosis Date  . Allergy   . Chicken pox   . Frequent headaches     No past surgical history on file.  There were no vitals filed for this visit.  Subjective Assessment - 07/25/18 0904    Subjective  I am feeling better than I have before, No headaches    Pertinent History  has 59 mo old- breast feeding    Diagnostic tests  xrays-clear    Patient Stated Goals  get rid of the pain in the neck and back; return to normal activities     Currently in Pain?  No/denies    Pain Score  0-No pain    Pain Location  Head    Pain Orientation  Left    Pain Score  0    Pain Location  Neck   left scalene   Pain Descriptors / Indicators  Tightness                       [facility] Adult PT Treatment/Exercise - 07/25/18 0911      Neck Exercises: Standing   Other Standing Exercises  row blue tband 2 x 15      Neck Exercises: Seated   Money  15 reps   with  blue theraband     Shoulder Exercises: Standing   Other Standing Exercises  wall push up with a plus 2 x 10   verbal cues for proper form   Other Standing Exercises  lower trap wall Y's with lift off 1 x 10       Moist Heat Therapy   Number Minutes Moist Heat  15 Minutes    Moist Heat Location  Cervical       Manual Therapy   Manual Therapy  Soft tissue mobilization;Joint mobilization    Manual therapy comments  skilled palpation and monitoring during TPDN    Joint Mobilization  PA mob C-3 to C-6 and lateral UPA C 4 to C-6     Soft tissue mobilization  Lt upper trap, suboccipital  scalene SCM release       Trigger Point Dry Needling - 07/25/18 0001    Consent Given?  Yes    Education Handout Provided  Previously provided    Muscles Treated Head and Neck  Sternocleidomastoid;Scalenes   left only   Sternocleidomastoid Response  Palpable increased muscle length;Twitch response elicited    Scalenes Response  Twitch reponse elicited;Palpable increased muscle length           PT Education - 07/25/18 0936    Education Details  reinforced HEP and importance of exercise  Person(s) Educated  Patient    Methods  Explanation;Demonstration    Comprehension  Verbalized understanding;Returned demonstration          PT Long Term Goals - 07/16/18 1732      PT LONG TERM GOAL #1   Title  Decrease pain to 0/10 to 2/10 at most with all functional activities and at rest     Baseline  driving creates significant pain at C6    Status  On-going      PT LONG TERM GOAL #2   Title  Increased cervical ROM by 5-10 deg in all planes     Baseline  see flowsheet    Status  Achieved      PT LONG TERM GOAL #3   Title  Patient demonstrates improved posture and alignment with posterior shoulder girdle engaged     Baseline  able to correct but will return to slouch- lacking endurance, requires reminders    Status  On-going      PT LONG TERM GOAL #4   Title  Independent in HEP     Baseline  do it as needed but not as consistent as it should be    Status  On-going      PT LONG TERM GOAL #5   Title  Improve FOTO to </= 23% limitation    Baseline  35% limited    Status  On-going            Plan - 07/25/18 0930    Clinical Impression Statement  Pt with decreased tightness but specifically  residual tightness on left scalene and SCM.  no goals achieved today but reinforced HEP. Pt consented to TPDN and was closely monitored throughout RX.  Pt also performed RX and  given blue t band for exericise performed. Pt educated on importance of exericise moving forward in order to care for her self at home.  Pt reports no headache today. and verbalized understanding of needing exericise especially as she weans her baby from breast feeding which triggers her pain response.      Rehab Potential  Good    PT Frequency  2x / week    PT Duration  4 weeks    PT Treatment/Interventions  Patient/family education;ADLs/Self Care Home Management;Cryotherapy;Electrical Stimulation;Iontophoresis 4mg /ml Dexamethasone;Moist Heat;Ultrasound;Dry needling;Manual techniques;Neuromuscular re-education;Therapeutic activities;Therapeutic exercise    PT Next Visit Plan  Try to move on to self care/exercise.  for ultimiate DC    PT Home Exercise Plan  doorway/corner stretch, chin tuck, scap squeeze, horizontal abd red and ER red; open book, hooklying ab set; (rhomboid stretch, door pec stretch, thoracic ext), horiz abd red tband    Consulted and Agree with Plan of Care  Patient       Patient will benefit from skilled therapeutic intervention in order to improve the following deficits and impairments:  Postural dysfunction, Improper body mechanics, Pain, Increased fascial restricitons, Increased muscle spasms, Decreased mobility, Decreased range of motion, Decreased activity tolerance  Visit Diagnosis: Pain in thoracic spine  Cervicalgia  Acute bilateral low back pain without sciatica  Other symptoms and signs involving the musculoskeletal system  Abnormal posture     Problem List Patient Active Problem List   Diagnosis Date Noted  . Physical exam 01/09/2018  . SVD (spontaneous vaginal delivery) 12/04/2017  . Postpartum care following vaginal delivery (7/15) 12/04/2017  . Allergic rhinitis 06/17/2014    Voncille Lo, PT Certified Exercise Expert for the Aging Adult  07/25/18 9:38 AM Phone: [phone] Fax: [phone]  [place], [place], [address] Phone: [phone]   Fax:  [phone]  Name: Brenda Freeman MRN: [id number] Date of Birth: Aug 26, 1995

## 2018-07-26 ENCOUNTER — Ambulatory Visit: Payer: 59 | Admitting: Physical Therapy

## 2018-07-26 ENCOUNTER — Encounter: Payer: Self-pay | Admitting: Physical Therapy

## 2018-07-26 DIAGNOSIS — M542 Cervicalgia: Secondary | ICD-10-CM

## 2018-07-26 DIAGNOSIS — M546 Pain in thoracic spine: Secondary | ICD-10-CM

## 2018-07-26 NOTE — Therapy (Signed)
[place], [place], [address] Phone: [phone]   Fax:  [phone]  Physical Therapy Treatment  Patient Details  Name: Brenda Freeman MRN: [id number] Date of Birth: 08-Mar-1996 Referring Provider (PT): Dr Birdie Riddle   Encounter Date: 07/26/2018  PT End of Session - 07/26/18 0859    Visit Number  22    Number of Visits  27    Date for PT Re-Evaluation  08/18/18    Authorization Type  UHC    PT Start Time  [phone]   pt arrived late   PT Stop Time  0930    PT Time Calculation (min)  31 min    Activity Tolerance  Patient tolerated treatment well    Behavior During Therapy  [facility] for tasks assessed/performed       Past Medical History:  Diagnosis Date  . Allergy   . Chicken pox   . Frequent headaches     History reviewed. No pertinent surgical history.  There were no vitals filed for this visit.  Subjective Assessment - 07/26/18 0901    Subjective  Felt odd in upper back yesterday like it needed to pop. No HA today or yesterday.     Patient Stated Goals  get rid of the pain in the neck and back; return to normal activities     Currently in Pain?  No/denies                       [facility] Adult PT Treatment/Exercise - 07/26/18 0001      Shoulder Exercises: Standing   Other Standing Exercises  W pull blue tband      Shoulder Exercises: Stretch   Other Shoulder Stretches  open book stretch    Other Shoulder Stretches  cat/camel/child pose      Manual Therapy   Manual therapy comments  edu self use theracane             PT Education - 07/26/18 1043    Education Details  theracane, sinus HA vs trigger point referral HA    Person(s) Educated  Patient    Methods  Explanation    Comprehension  Verbalized understanding;Need further instruction          PT Long Term Goals - 07/16/18 1732      PT LONG TERM GOAL #1   Title  Decrease pain to 0/10 to 2/10 at most with all functional  activities and at rest     Baseline  driving creates significant pain at C6    Status  On-going      PT LONG TERM GOAL #2   Title  Increased cervical ROM by 5-10 deg in all planes     Baseline  see flowsheet    Status  Achieved      PT LONG TERM GOAL #3   Title  Patient demonstrates improved posture and alignment with posterior shoulder girdle engaged     Baseline  able to correct but will return to slouch- lacking endurance, requires reminders    Status  On-going      PT LONG TERM GOAL #4   Title  Independent in HEP     Baseline  do it as needed but not as consistent as it should be    Status  On-going      PT LONG TERM GOAL #5   Title  Improve FOTO to </= 23% limitation    Baseline  35% limited  Status  On-going            Plan - 07/26/18 7353    Clinical Impression Statement  trigger point in Lt rhomboid resolved with use of theracane and feeling that her back needed to pop resolved after exercises. printout with these sent via SMS text and asked her to do them daily.     PT Treatment/Interventions  Patient/family education;ADLs/Self Care Home Management;Cryotherapy;Electrical Stimulation;Iontophoresis 4mg /ml Dexamethasone;Moist Heat;Ultrasound;Dry needling;Manual techniques;Neuromuscular re-education;Therapeutic activities;Therapeutic exercise    PT Next Visit Plan  Try to move on to self care/exercise.  for ultimiate DC    PT Home Exercise Plan  doorway/corner stretch, chin tuck, scap squeeze, horizontal abd red and ER red; open book, hooklying ab set; (rhomboid stretch, door pec stretch, thoracic ext), horiz abd red tband    Consulted and Agree with Plan of Care  Patient       Patient will benefit from skilled therapeutic intervention in order to improve the following deficits and impairments:  Postural dysfunction, Improper body mechanics, Pain, Increased fascial restricitons, Increased muscle spasms, Decreased mobility, Decreased range of motion, Decreased activity  tolerance  Visit Diagnosis: Pain in thoracic spine  Cervicalgia     Problem List Patient Active Problem List   Diagnosis Date Noted  . Physical exam 01/09/2018  . SVD (spontaneous vaginal delivery) 12/04/2017  . Postpartum care following vaginal delivery (7/15) 12/04/2017  . Allergic rhinitis 06/17/2014    Brenda Freeman C. Brenda Freeman PT, DPT 07/26/18 10:44 AM   [facility] [address] [place], [place], [address] Phone: [phone]   Fax:  [phone]  Name: Brenda Freeman MRN: [id number] Date of Birth: 01/18/1996

## 2018-07-26 NOTE — Patient Instructions (Signed)
Access Code: [email or web address]  URL: [email or web address]  Date: 07/26/2018  Prepared by: Selinda Eon   Exercises  Standing Shoulder Extension with Dowel - 15 reps - 1 sets - 5s hold - 1x daily - 7x weekly  Standing Shoulder External Rotation with Resistance - 10 reps - 1 sets - 2s hold - 3x daily - 7x weekly

## 2018-07-27 ENCOUNTER — Other Ambulatory Visit: Payer: Self-pay | Admitting: Family Medicine

## 2018-07-29 ENCOUNTER — Ambulatory Visit: Payer: 59 | Admitting: Physical Therapy

## 2018-07-29 ENCOUNTER — Encounter: Payer: Self-pay | Admitting: Physical Therapy

## 2018-07-29 DIAGNOSIS — M542 Cervicalgia: Secondary | ICD-10-CM

## 2018-07-29 DIAGNOSIS — M546 Pain in thoracic spine: Secondary | ICD-10-CM | POA: Diagnosis not present

## 2018-07-29 NOTE — Therapy (Signed)
[place], [place], [address] Phone: [phone]   Fax:  [phone]  Physical Therapy Treatment  Patient Details  Name: Brenda Freeman MRN: [id number] Date of Birth: [birth year] Referring Provider (PT): Dr Birdie Riddle   Encounter Date: 07/29/2018  PT End of Session - 07/29/18 1029    Visit Number  23    Number of Visits  27    Date for PT Re-Evaluation  08/18/18    Authorization Type  UHC    PT Start Time  1029   pt arrived late   PT Stop Time  1100    PT Time Calculation (min)  31 min    Activity Tolerance  Patient tolerated treatment well    Behavior During Therapy  [facility] for tasks assessed/performed       Past Medical History:  Diagnosis Date  . Allergy   . Chicken pox   . Frequent headaches     History reviewed. No pertinent surgical history.  There were no vitals filed for this visit.  Subjective Assessment - 07/29/18 1029    Subjective  the way I slept last night has everything to do with the way my back is feeling. sitting on the couch Sat had a sharp pain in left temple and had a HA on sunday. I notice that if I drive a lot i will get a HA in the next 24-48 hr.     Patient Stated Goals  get rid of the pain in the neck and back; return to normal activities                        [facility] Adult PT Treatment/Exercise - 07/29/18 0001      Neck Exercises: Supine   Other Supine Exercise  horiz abd red tband    Other Supine Exercise  supine diagonals red tband; external rotation red tband      Shoulder Exercises: Stretch   Other Shoulder Stretches  open book x3 each             PT Education - 07/29/18 1054    Education Details  sunglasses & eye wear, progressing toward d/c    Person(s) Educated  Patient    Methods  Explanation    Comprehension  Verbalized understanding;Need further instruction          PT Long Term Goals - 07/16/18 1732      PT LONG TERM GOAL #1   Title  Decrease pain to 0/10 to 2/10 at most with all functional activities and at rest     Baseline  driving creates significant pain at C6    Status  On-going      PT LONG TERM GOAL #2   Title  Increased cervical ROM by 5-10 deg in all planes     Baseline  see flowsheet    Status  Achieved      PT LONG TERM GOAL #3   Title  Patient demonstrates improved posture and alignment with posterior shoulder girdle engaged     Baseline  able to correct but will return to slouch- lacking endurance, requires reminders    Status  On-going      PT LONG TERM GOAL #4   Title  Independent in HEP     Baseline  do it as needed but not as consistent as it should be    Status  On-going      PT LONG TERM GOAL #5  Title  Improve FOTO to </= 23% limitation    Baseline  35% limited    Status  On-going            Plan - 07/29/18 1105    Clinical Impression Statement  Has been able to link HA to driving and we discussed using polarized sunglasses to decrease strain with glare and bright light. Added supine strengthening to HEP for postural strength. Moving POC to 1/week as she progresses toward independence.     PT Treatment/Interventions  Patient/family education;ADLs/Self Care Home Management;Cryotherapy;Electrical Stimulation;Iontophoresis 4mg /ml Dexamethasone;Moist Heat;Ultrasound;Dry needling;Manual techniques;Neuromuscular re-education;Therapeutic activities;Therapeutic exercise    PT Next Visit Plan  Try to move on to self care/exercise.  for ultimiate DC    PT Home Exercise Plan  doorway/corner stretch, chin tuck, scap squeeze, horizontal abd red and ER red; open book, hooklying ab set; (rhomboid stretch, door pec stretch, thoracic ext), horiz abd red tband, supine diagonals    Consulted and Agree with Plan of Care  Patient       Patient will benefit from skilled therapeutic intervention in order to improve the following deficits and impairments:  Postural dysfunction, Improper body  mechanics, Pain, Increased fascial restricitons, Increased muscle spasms, Decreased mobility, Decreased range of motion, Decreased activity tolerance  Visit Diagnosis: Pain in thoracic spine  Cervicalgia     Problem List Patient Active Problem List   Diagnosis Date Noted  . Physical exam 01/09/2018  . SVD (spontaneous vaginal delivery) 12/04/2017  . Postpartum care following vaginal delivery (7/15) 12/04/2017  . Allergic rhinitis 06/17/2014    Linville Decarolis C. Jonnelle Lawniczak PT, DPT 07/29/18 11:07 AM   [place], [place], [address] Phone: [phone]   Fax:  [phone]  Name: Brenda Freeman MRN: [id number] Date of Birth: [birth year]

## 2018-08-02 ENCOUNTER — Ambulatory Visit: Payer: 59 | Admitting: Physical Therapy

## 2018-08-13 ENCOUNTER — Ambulatory Visit: Payer: 59 | Admitting: Physical Therapy

## 2018-08-15 ENCOUNTER — Ambulatory Visit: Payer: 59 | Admitting: Physical Therapy

## 2018-08-20 ENCOUNTER — Ambulatory Visit: Payer: 59 | Admitting: Physical Therapy

## 2018-08-22 ENCOUNTER — Ambulatory Visit: Payer: 59 | Admitting: Physical Therapy

## 2018-08-28 ENCOUNTER — Telehealth (HOSPITAL_COMMUNITY): Payer: Self-pay | Admitting: Physical Therapy

## 2018-08-28 NOTE — Telephone Encounter (Signed)
I spoke with Ms Dobesh today and informed her about our extended closing of the clinic. She was asked and she reported she was interested in [facility]. I asked her to contact us if she had not herd from [place] by end of next week.

## 2018-11-12 ENCOUNTER — Ambulatory Visit: Payer: 59 | Admitting: Physical Therapy

## 2018-11-26 ENCOUNTER — Telehealth: Payer: Self-pay

## 2018-11-26 NOTE — Telephone Encounter (Signed)
error 

## 2018-11-26 NOTE — Telephone Encounter (Signed)
Copied from [place] [id number]. Topic: Quick Communication - See Telephone Encounter >> Nov 25, 2018  4:56 PM Loma [place] wrote: CRM for notification. See Telephone encounter for: 11/25/18. Pt having pain,  dull ache in tail bone when sitting down. Please FU with appt asap  [phone]      Attempted to call and schedule appt. VM full, unable to LM

## 2018-12-18 ENCOUNTER — Ambulatory Visit: Payer: 59 | Attending: Family Medicine | Admitting: Physical Therapy

## 2018-12-18 ENCOUNTER — Encounter: Payer: Self-pay | Admitting: Physical Therapy

## 2018-12-18 DIAGNOSIS — M542 Cervicalgia: Secondary | ICD-10-CM | POA: Diagnosis present

## 2018-12-18 DIAGNOSIS — M546 Pain in thoracic spine: Secondary | ICD-10-CM | POA: Diagnosis present

## 2018-12-18 NOTE — Therapy (Signed)
[place], [place], [address] Phone: [phone]   Fax:  [phone]  Physical Therapy Treatment/Discharge  Patient Details  Name: Brenda Freeman MRN: [id number] Date of Birth: 02/15/1996 Referring Provider (PT): Dr Birdie Riddle   Encounter Date: 12/18/2018  PT End of Session - 12/18/18 1058    Visit Number  24    Authorization Type  UHC    PT Start Time  4680    PT Stop Time  1058    PT Time Calculation (min)  23 min    Behavior During Therapy  [facility] for tasks assessed/performed       Past Medical History:  Diagnosis Date  . Allergy   . Chicken pox   . Frequent headaches     History reviewed. No pertinent surgical history.  There were no vitals filed for this visit.  Subjective Assessment - 12/18/18 1039    Subjective  Feb, March, April time I was having some pain but my theracane is helpful. One spot I cannot seem to get rid of. Started having more HA where my vision would blur and I had a dull HA. Did purchase polarized sunglasses, my daughter got a hold of them so I was out without them I did get a HA. I don't get a HA as much not going outside. Not as much HA while looking at computer. I feel less tight, have been doing yoga at home. Still breast feeding.    Patient Stated Goals  get rid of the pain in the neck and back; return to normal activities     Currently in Pain?  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    Person(s) Educated   Patient    Methods  Explanation    Comprehension  Verbalized understanding          PT Long Term Goals - 12/18/18 1109      PT LONG TERM GOAL #1   Title  Decrease pain to 0/10 to 2/10 at most with all functional activities and at rest     Status  Achieved      PT LONG TERM GOAL #2   Baseline  see flowsheet    Status  Achieved      PT LONG TERM GOAL #3   Title  Patient demonstrates improved posture and alignment with posterior shoulder girdle engaged     Baseline  reports ability to utilize proper posture    Status  Achieved      PT LONG TERM GOAL #4   Title  Independent in HEP     Baseline  do it as needed but not as consistent as it should be    Status  Partially Met      PT LONG TERM  GOAL #5   Title  Improve FOTO to </= 23% limitation    Status  Unable to assess            Plan - 12/18/18 1107    Clinical Impression Statement  At this time, pt has felt a decrease in cervical-thoracic MSK pain but continues to have HA with vision changes. Was able to pinpoint them to being outside. We agreed that we will d/c from ortho PT and will request referral to pelvic health PT as she is having coccyx pain without low back or hip pain and she will contact an OD for eye exam. Encouraged pt to contact us with any further questions.    PT Treatment/Interventions  Patient/family education;ADLs/Self Care Home Management;Cryotherapy;Electrical Stimulation;Iontophoresis 4mg/ml Dexamethasone;Moist Heat;Ultrasound;Dry needling;Manual techniques;Neuromuscular re-education;Therapeutic activities;Therapeutic exercise    PT Home Exercise Plan  doorway/corner stretch, chin tuck, scap squeeze, horizontal abd red and ER red; open book, hooklying ab set; (rhomboid stretch, door pec stretch, thoracic ext), horiz abd red tband, supine diagonals    Consulted and Agree with Plan of Care  Patient       Patient will benefit from skilled therapeutic intervention in order to improve the following  deficits and impairments:  Postural dysfunction, Improper body mechanics, Pain, Increased fascial restricitons, Increased muscle spasms, Decreased mobility, Decreased range of motion, Decreased activity tolerance  Visit Diagnosis: 1. Pain in thoracic spine   2. Cervicalgia        Problem List Patient Active Problem List   Diagnosis Date Noted  . Physical exam 01/09/2018  . SVD (spontaneous vaginal delivery) 12/04/2017  . Postpartum care following vaginal delivery (7/15) 12/04/2017  . Allergic rhinitis 06/17/2014    PHYSICAL THERAPY DISCHARGE SUMMARY  Visits from Start of Care: 24  Current functional level related to goals / functional outcomes: See above   Remaining deficits: See above   Education / Equipment: Anatomy of condition, POC,HEP, exercise form/rationale  Plan: Patient agrees to discharge.  Patient goals were partially met. Patient is being discharged due to lack of progress.  ?????      C.  PT, DPT 12/18/18 1:00 PM   [facility] [address] [place], [place], [address] Phone: [phone]   Fax:  [phone]  Name: Brenda Freeman MRN: [id number] Date of Birth: 11/22/1995   

## 2019-03-12 ENCOUNTER — Ambulatory Visit (INDEPENDENT_AMBULATORY_CARE_PROVIDER_SITE_OTHER): Payer: 59 | Admitting: Family Medicine

## 2019-03-12 ENCOUNTER — Other Ambulatory Visit: Payer: Self-pay

## 2019-03-12 ENCOUNTER — Encounter: Payer: Self-pay | Admitting: Family Medicine

## 2019-03-12 DIAGNOSIS — J309 Allergic rhinitis, unspecified: Secondary | ICD-10-CM

## 2019-03-12 DIAGNOSIS — G43109 Migraine with aura, not intractable, without status migrainosus: Secondary | ICD-10-CM | POA: Insufficient documentation

## 2019-03-12 NOTE — Progress Notes (Signed)
I have discussed the procedure for the virtual visit with the patient who has given consent to proceed with assessment and treatment.   Pt unable to obtain vitals, Pt states she is still having blurry vision. Was evaluated yesterday at eye doctor and stated everything was normal.   Davis Gourd, CMA
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
## 2019-04-09 ENCOUNTER — Encounter: Payer: Self-pay | Admitting: Neurology

## 2019-04-09 ENCOUNTER — Telehealth (INDEPENDENT_AMBULATORY_CARE_PROVIDER_SITE_OTHER): Payer: 59 | Admitting: Neurology

## 2019-04-09 ENCOUNTER — Other Ambulatory Visit: Payer: Self-pay

## 2019-04-09 VITALS — Ht 64.0 in | Wt 115.0 lb

## 2019-04-09 DIAGNOSIS — G4485 Primary stabbing headache: Secondary | ICD-10-CM

## 2019-04-09 DIAGNOSIS — G43109 Migraine with aura, not intractable, without status migrainosus: Secondary | ICD-10-CM

## 2019-04-09 MED ORDER — SUMATRIPTAN SUCCINATE 100 MG PO TABS: ORAL_TABLET | ORAL | 3 refills | Status: DC

## 2019-04-09 NOTE — Patient Instructions (Signed)
When you get a migraine, take sumatriptan 100mg .  May repeat dose once after 2 hours if needed (maximum 2 tablets in 24 hours).  Hold breast feeding for 8 to 12 hours after taking the medication.  For feedings within that time, recommended using saved breast milk pumped prior to taking the sumatriptan.  Follow up in 4 months.
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
## 2019-08-14 ENCOUNTER — Ambulatory Visit: Payer: 59 | Admitting: Neurology

## 2020-02-04 ENCOUNTER — Ambulatory Visit: Payer: Self-pay

## 2020-02-07 ENCOUNTER — Other Ambulatory Visit: Payer: Self-pay

## 2020-02-07 ENCOUNTER — Ambulatory Visit (INDEPENDENT_AMBULATORY_CARE_PROVIDER_SITE_OTHER): Payer: BC Managed Care – PPO

## 2020-02-07 VITALS — Wt 118.0 lb

## 2020-02-07 DIAGNOSIS — Z23 Encounter for immunization: Secondary | ICD-10-CM

## 2020-02-07 NOTE — Progress Notes (Signed)
   Covid-19 Vaccination Clinic  Name:  Shukri Nistler    MRN: [id number] DOB: 1995-07-08  02/07/2020  Ms. Ging was observed post Covid-19 immunization for 15 minutes without incident. She was provided with Vaccine Information Sheet and instruction to access the V-Safe system.   Ms. Leuthold was instructed to call 911 with any severe reactions post vaccine: Marland Kitchen Difficulty breathing  . Swelling of face and throat  . A fast heartbeat  . A bad rash all over body  . Dizziness and weakness   Immunizations Administered    Name Date Dose VIS Date Route   Pfizer COVID-19 Vaccine 02/07/2020 11:45 AM 0.3 mL 07/16/2018 Intramuscular   Manufacturer: [employer], [employer]   Lot: [id number]   [place]: [id number]

## 2020-02-28 ENCOUNTER — Other Ambulatory Visit: Payer: Self-pay

## 2020-02-28 ENCOUNTER — Ambulatory Visit (INDEPENDENT_AMBULATORY_CARE_PROVIDER_SITE_OTHER): Payer: BC Managed Care – PPO

## 2020-02-28 DIAGNOSIS — Z23 Encounter for immunization: Secondary | ICD-10-CM | POA: Diagnosis not present

## 2020-05-11 ENCOUNTER — Ambulatory Visit (INDEPENDENT_AMBULATORY_CARE_PROVIDER_SITE_OTHER): Payer: BC Managed Care – PPO | Admitting: Family Medicine

## 2020-05-11 ENCOUNTER — Encounter: Payer: Self-pay | Admitting: Family Medicine

## 2020-05-11 ENCOUNTER — Other Ambulatory Visit: Payer: Self-pay

## 2020-05-11 VITALS — BP 110/80 | Temp 97.5°F | Resp 16 | Ht 64.0 in | Wt 117.4 lb

## 2020-05-11 DIAGNOSIS — M545 Low back pain, unspecified: Secondary | ICD-10-CM | POA: Diagnosis not present

## 2020-05-11 MED ORDER — PREDNISONE 10 MG PO TABS: ORAL_TABLET | ORAL | 0 refills | Status: DC

## 2020-05-11 NOTE — Progress Notes (Signed)
   Subjective:    Patient ID: Brenda Freeman, female    DOB: [birth year], 24 y.o.   MRN: [id number]  HPI Back pain- sxs started 'a few weeks ago'.  Initially thought it was her mattress but she switched mattress 2 weeks ago and it hasn't made a difference.  Unable to sit cross legged on the floor.  Unable to sit on bar stools or even toilet for any period of time.  Pain is in the lower back.  No known injury.  Pt does carry 26 yr old in a carrier that she wears in the front.  Pain is worse first thing in the AM.  Has pain going up and down the stairs.  Pain w/ prolonged standing.  Pain w/ both flexion and extension.  No radiation of pain.  No changes to bowel or bladder habits.   Review of Systems For ROS see HPI   This visit occurred during the SARS-CoV-2 public health emergency.  Safety protocols were in place, including screening questions prior to the visit, additional usage of staff PPE, and extensive cleaning of exam room while observing appropriate contact time as indicated for disinfecting solutions.       Objective:   Physical Exam Vitals reviewed.  Constitutional:      General: She is not in acute distress.    Appearance: Normal appearance. She is not ill-appearing.  HENT:     Head: Normocephalic and atraumatic.  Cardiovascular:     Pulses: Normal pulses.  Musculoskeletal:        General: Tenderness (TTP over lumbar spine and paraspinal muscles bilaterally) present.     Comments: Pain w/ forward flexion and back extension  Skin:    General: Skin is warm and dry.     Findings: No bruising, lesion or rash.  Neurological:     General: No focal deficit present.     Mental Status: She is alert and oriented to person, place, and time.     Cranial Nerves: No cranial nerve deficit.     Sensory: No sensory deficit.     Motor: No weakness.     Gait: Gait normal.     Deep Tendon Reflexes: Reflexes normal.     Comments: + SLR on R, negative on L           Assessment & Plan:   Lumbar back pain- new.  sxs started ~2 weeks ago.  She has a 26 yr old that she lifts, carries, holds and gets up and down off the floor repeatedly.  She now has lumbar pain w/ prolonged sitting or standing- particularly if her back is not supported.  No red flags on hx or exam- no bowel or bladder incontinence, no numbness or weakness.  Start Prednisone taper.  Avoid muscle relaxers as she is breast feeding.  Pt expressed understanding and is in agreement w/ plan.

## 2020-05-11 NOTE — Patient Instructions (Addendum)
Follow up as needed or as scheduled START the Prednisone as directed and take w/ food.  3 tabs at the same time x3 days, and then 2 tabs at the same time x3 days, and then 1 tab daily. HEAT!!! Do some gentle stretching- bend forward to where you feel it pull and then hold.  Same w/ arching backwards- hold when you feel it pull If no improvement by next week- let me know! Call with any questions or concerns Stay Safe!  Stay Healthy! Happy Holidays!

## 2020-05-28 ENCOUNTER — Encounter: Payer: 59 | Admitting: Family Medicine

## 2020-06-05 ENCOUNTER — Encounter: Payer: Self-pay | Admitting: Family Medicine

## 2020-06-15 DIAGNOSIS — Z3687 Encounter for antenatal screening for uncertain dates: Secondary | ICD-10-CM | POA: Diagnosis not present

## 2020-06-16 DIAGNOSIS — Z3200 Encounter for pregnancy test, result unknown: Secondary | ICD-10-CM | POA: Diagnosis not present
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
## 2020-06-18 DIAGNOSIS — Z3687 Encounter for antenatal screening for uncertain dates: Secondary | ICD-10-CM | POA: Diagnosis not present

## 2020-06-21 DIAGNOSIS — Z3687 Encounter for antenatal screening for uncertain dates: Secondary | ICD-10-CM | POA: Diagnosis not present

## 2020-06-23 DIAGNOSIS — Z3687 Encounter for antenatal screening for uncertain dates: Secondary | ICD-10-CM | POA: Diagnosis not present

## 2020-06-25 ENCOUNTER — Inpatient Hospital Stay (HOSPITAL_COMMUNITY)
Admission: AD | Admit: 2020-06-25 | Discharge: 2020-06-25 | Disposition: A | Discharge status: Home / Self Care | Payer: BC Managed Care – PPO | Attending: Obstetrics and Gynecology | Admitting: Obstetrics and Gynecology

## 2020-06-25 ENCOUNTER — Other Ambulatory Visit: Payer: Self-pay | Admitting: Obstetrics & Gynecology

## 2020-06-25 ENCOUNTER — Other Ambulatory Visit: Payer: Self-pay

## 2020-06-25 DIAGNOSIS — O00101 Right tubal pregnancy without intrauterine pregnancy: Secondary | ICD-10-CM | POA: Insufficient documentation

## 2020-06-25 DIAGNOSIS — O0281 Inappropriate change in quantitative human chorionic gonadotropin (hCG) in early pregnancy: Secondary | ICD-10-CM | POA: Diagnosis not present

## 2020-06-25 LAB — CBC WITH DIFFERENTIAL/PLATELET
Abs Immature Granulocytes: 0.01 10*3/uL (ref 0.00–0.07)
Basophils Absolute: 0 10*3/uL (ref 0.0–0.1)
Basophils Relative: 0 %
Eosinophils Absolute: 0.1 10*3/uL (ref 0.0–0.5)
Eosinophils Relative: 2 %
HCT: 37.6 % (ref 36.0–46.0)
Hemoglobin: 12.3 g/dL (ref 12.0–15.0)
Immature Granulocytes: 0 %
Lymphocytes Relative: 42 %
Lymphs Abs: 1.4 10*3/uL (ref 0.7–4.0)
MCH: 27 pg (ref 26.0–34.0)
MCHC: 32.7 g/dL (ref 30.0–36.0)
MCV: 82.6 fL (ref 80.0–100.0)
Monocytes Absolute: 0.3 10*3/uL (ref 0.1–1.0)
Monocytes Relative: 8 %
Neutro Abs: 1.7 10*3/uL (ref 1.7–7.7)
Neutrophils Relative %: 48 %
Platelets: 206 10*3/uL (ref 150–400)
RBC: 4.55 MIL/uL (ref 3.87–5.11)
RDW: 12.7 % (ref 11.5–15.5)
WBC: 3.4 10*3/uL — ABNORMAL LOW (ref 4.0–10.5)
nRBC: 0 % (ref 0.0–0.2)

## 2020-06-25 LAB — COMPREHENSIVE METABOLIC PANEL
ALT: 16 U/L (ref 0–44)
AST: 16 U/L (ref 15–41)
Albumin: 4 g/dL (ref 3.5–5.0)
Alkaline Phosphatase: 47 U/L (ref 38–126)
Anion gap: 9 (ref 5–15)
BUN: 8 mg/dL (ref 6–20)
CO2: 25 mmol/L (ref 22–32)
Calcium: 9.2 mg/dL (ref 8.9–10.3)
Chloride: 105 mmol/L (ref 98–111)
Creatinine, Ser: 0.66 mg/dL (ref 0.44–1.00)
GFR, Estimated: >60 mL/min (ref >60)
Glucose, Bld: 89 mg/dL (ref 70–99)
Potassium: 4.1 mmol/L (ref 3.5–5.1)
Sodium: 139 mmol/L (ref 135–145)
Total Bilirubin: 0.8 mg/dL (ref 0.3–1.2)
Total Protein: 7.1 g/dL (ref 6.5–8.1)

## 2020-06-25 LAB — HCG, QUANTITATIVE, PREGNANCY: hCG, Beta Chain, Quant, S: 1136 m[IU]/mL — ABNORMAL HIGH (ref <5)

## 2020-06-25 MED ORDER — METHOTREXATE SODIUM CHEMO INJECTION 50 MG/2ML
50.0000 mg/m2 | Freq: Once | INTRAMUSCULAR | Status: AC
  Administered 2020-06-25: 80 mg via INTRAMUSCULAR
  Filled 2020-06-25: qty 3.2

## 2020-06-25 MED ORDER — METHOTREXATE SODIUM CHEMO INJECTION 50 MG/2ML
50.0000 mg/m2 | Freq: Once | INTRAMUSCULAR | Status: DC
  Filled 2020-06-25: qty 3.2

## 2020-06-25 MED ORDER — METHOTREXATE FOR ECTOPIC PREGNANCY
50.0000 mg/m2 | Freq: Once | INTRAMUSCULAR | Status: DC
  Filled 2020-06-25: qty 1

## 2020-06-25 NOTE — MAU Note (Signed)
Pt sent from [facility] office to receive Methotrexate for ectopic pregnancy.

## 2020-06-25 NOTE — MAU Note (Signed)
This is for documentation. Pt was seen by me in office.  G2P1001, early quants done for unknown LMP, breast feeding.  Quant in office x 4, Abn rise, last quant 507 on 06/23/20. Denies pain/ vag bleeding.  Office sono today-  Uterus with possible interstitial ectopic - double ring 6x5 mm, right CLC, no ff  Counselled in office, asymptomatic, early ectopic- option for medical therapy with Methotrexate/ risks/ complications vs surgery that will involve cornual resection vs salpingectomy/ risks/ complications but dont recommend waiting and following quants esp with sono findings.  Pt agreed, I spoke with her husband on phone, both voiced understanding.  Also d/w pt no pregnancy for 3 months, stop all vitamins/ supplments  She has f/up quant D4 on 2/7 and D7 and 2/10 with another sono in office.   --V.Benjie Karvonen, MD

## 2020-06-25 NOTE — Progress Notes (Signed)
Order received to give Methotrexate as ordered.

## 2020-06-28 DIAGNOSIS — O009 Unspecified ectopic pregnancy without intrauterine pregnancy: Secondary | ICD-10-CM | POA: Diagnosis not present

## 2020-06-30 ENCOUNTER — Other Ambulatory Visit: Payer: Self-pay

## 2020-06-30 ENCOUNTER — Encounter: Payer: Self-pay | Admitting: Family Medicine

## 2020-06-30 ENCOUNTER — Ambulatory Visit (INDEPENDENT_AMBULATORY_CARE_PROVIDER_SITE_OTHER): Payer: BC Managed Care – PPO | Admitting: Family Medicine

## 2020-06-30 VITALS — BP 110/80 | HR 76 | Temp 97.7°F | Resp 17 | Ht 66.0 in | Wt 120.4 lb

## 2020-06-30 DIAGNOSIS — Z0000 Encounter for general adult medical examination without abnormal findings: Secondary | ICD-10-CM | POA: Diagnosis not present

## 2020-06-30 NOTE — Assessment & Plan Note (Signed)
Pt's PE WNL.  UTD on GYN.  Had recent labs done at [facility].  No need to repeat.  UTD on immunizations.  Check labs.  Anticipatory guidance provided.

## 2020-06-30 NOTE — Progress Notes (Signed)
   Subjective:    Patient ID: Brenda Freeman, female    DOB: 07/08/1995, 25 y.o.   MRN: [id number]  HPI CPE- UTD on pap, COVID vaccines, flu, Tdap  Reviewed past medical, surgical, family and social histories.   Health Maintenance  Topic Date Due  . PAP-Cervical Cytology Screening  07/27/2020 (Originally 09/14/2014)  . PAP SMEAR-Modifier  07/27/2020 (Originally 07/21/2019)  . Hepatitis C Screening  07/27/2020 (Originally 1995/06/11)  . COVID-19 Vaccine (3 - Booster for Pfizer series) 08/28/2020  . TETANUS/TDAP  06/13/2025  . INFLUENZA VACCINE  Completed  . HIV Screening  Completed    Patient Care Team    Relationship Specialty Notifications Start End  Midge Minium, MD PCP - General Family Medicine  01/09/18   Azucena Fallen, MD Consulting Physician Obstetrics and Gynecology  01/09/18   Dr. Danne Baxter  Dentistry  01/09/18      Review of Systems Patient reports no vision/ hearing changes, adenopathy,fever, weight change,  persistant/recurrent hoarseness , swallowing issues, chest pain, palpitations, edema, persistant/recurrent cough, hemoptysis, dyspnea (rest/exertional/paroxysmal nocturnal), gastrointestinal bleeding (melena, rectal bleeding), abdominal pain, significant heartburn, bowel changes, GU symptoms (dysuria, hematuria, incontinence), Gyn symptoms (abnormal  bleeding, pain),  syncope, focal weakness, memory loss, numbness & tingling, skin/hair/nail changes, abnormal bruising or bleeding, anxiety, or depression.   This visit occurred during the SARS-CoV-2 public health emergency.  Safety protocols were in place, including screening questions prior to the visit, additional usage of staff PPE, and extensive cleaning of exam room while observing appropriate contact time as indicated for disinfecting solutions.       Objective:   Physical Exam General Appearance:    Alert, cooperative, no distress, appears stated age  Head:    Normocephalic, without obvious abnormality, atraumatic   Eyes:    PERRL, conjunctiva/corneas clear, EOM's intact, fundi    benign, both eyes  Ears:    Normal TM's and external ear canals, both ears  Nose:   Deferred due to COVID  Throat:   Neck:   Supple, symmetrical, trachea midline, no adenopathy;    Thyroid: no enlargement/tenderness/nodules  Back:     Symmetric, no curvature, ROM normal, no CVA tenderness  Lungs:     Clear to auscultation bilaterally, respirations unlabored  Chest Wall:    No tenderness or deformity   Heart:    Regular rate and rhythm, S1 and S2 normal, no murmur, rub   or gallop  Breast Exam:    Deferred to GYN  Abdomen:     Soft, non-tender, bowel sounds active all four quadrants,    no masses, no organomegaly  Genitalia:    Deferred to GYN  Rectal:    Extremities:   Extremities normal, atraumatic, no cyanosis or edema  Pulses:   2+ and symmetric all extremities  Skin:   Skin color, texture, turgor normal, no rashes or lesions  Lymph nodes:   Cervical, supraclavicular, and axillary nodes normal  Neurologic:   CNII-XII intact, normal strength, sensation and reflexes    throughout          Assessment & Plan:

## 2020-06-30 NOTE — Patient Instructions (Signed)
Follow up in 1 year or as needed No need for labs today- yay! Keep up the good work!  You look great! Call with any questions or concerns Stay Safe!  Stay Healthy!!

## 2020-07-01 ENCOUNTER — Other Ambulatory Visit: Payer: Self-pay | Admitting: Obstetrics & Gynecology

## 2020-07-01 ENCOUNTER — Inpatient Hospital Stay (HOSPITAL_COMMUNITY)
Admission: AD | Admit: 2020-07-01 | Discharge: 2020-07-01 | Disposition: A | Discharge status: Home / Self Care | Payer: BC Managed Care – PPO | Attending: Obstetrics and Gynecology | Admitting: Obstetrics and Gynecology

## 2020-07-01 ENCOUNTER — Other Ambulatory Visit: Payer: Self-pay

## 2020-07-01 ENCOUNTER — Encounter (HOSPITAL_COMMUNITY): Payer: Self-pay | Admitting: Obstetrics and Gynecology

## 2020-07-01 DIAGNOSIS — O00101 Right tubal pregnancy without intrauterine pregnancy: Secondary | ICD-10-CM | POA: Insufficient documentation

## 2020-07-01 LAB — COMPREHENSIVE METABOLIC PANEL
ALT: 14 U/L (ref 0–44)
AST: 18 U/L (ref 15–41)
Albumin: 4.2 g/dL (ref 3.5–5.0)
Alkaline Phosphatase: 54 U/L (ref 38–126)
Anion gap: 7 (ref 5–15)
BUN: 10 mg/dL (ref 6–20)
CO2: 25 mmol/L (ref 22–32)
Calcium: 8.9 mg/dL (ref 8.9–10.3)
Chloride: 107 mmol/L (ref 98–111)
Creatinine, Ser: 0.71 mg/dL (ref 0.44–1.00)
GFR, Estimated: >60 mL/min (ref >60)
Glucose, Bld: 86 mg/dL (ref 70–99)
Potassium: 3.8 mmol/L (ref 3.5–5.1)
Sodium: 139 mmol/L (ref 135–145)
Total Bilirubin: 0.9 mg/dL (ref 0.3–1.2)
Total Protein: 7.2 g/dL (ref 6.5–8.1)

## 2020-07-01 LAB — CBC WITH DIFFERENTIAL/PLATELET
Abs Immature Granulocytes: 0.01 10*3/uL (ref 0.00–0.07)
Basophils Absolute: 0 10*3/uL (ref 0.0–0.1)
Basophils Relative: 0 %
Eosinophils Absolute: 0 10*3/uL (ref 0.0–0.5)
Eosinophils Relative: 1 %
HCT: 34.6 % — ABNORMAL LOW (ref 36.0–46.0)
Hemoglobin: 11.7 g/dL — ABNORMAL LOW (ref 12.0–15.0)
Immature Granulocytes: 0 %
Lymphocytes Relative: 44 %
Lymphs Abs: 1.8 10*3/uL (ref 0.7–4.0)
MCH: 27.9 pg (ref 26.0–34.0)
MCHC: 33.8 g/dL (ref 30.0–36.0)
MCV: 82.6 fL (ref 80.0–100.0)
Monocytes Absolute: 0.3 10*3/uL (ref 0.1–1.0)
Monocytes Relative: 8 %
Neutro Abs: 1.9 10*3/uL (ref 1.7–7.7)
Neutrophils Relative %: 47 %
Platelets: 204 10*3/uL (ref 150–400)
RBC: 4.19 MIL/uL (ref 3.87–5.11)
RDW: 12.4 % (ref 11.5–15.5)
WBC: 4.1 10*3/uL (ref 4.0–10.5)
nRBC: 0 % (ref 0.0–0.2)

## 2020-07-01 MED ORDER — METHOTREXATE SODIUM CHEMO INJECTION 50 MG/2ML
50.0000 mg/m2 | Freq: Once | INTRAMUSCULAR | Status: AC
  Administered 2020-07-01: 80 mg via INTRAMUSCULAR
  Filled 2020-07-01: qty 3.2

## 2020-07-01 MED ORDER — METHOTREXATE FOR ECTOPIC PREGNANCY: 50.0000 mg/m2 | Freq: Once | INTRAMUSCULAR | Status: DC

## 2020-07-01 NOTE — MAU Note (Signed)
Pt was seen in office today with Dr Benjie Karvonen. Here for 2nd MTX injections and orders in already by Dr Benjie Karvonen. Pt had first MTX does 06/25/20. Denies any pain or VB. Understands to wait in lobby for lab results and then get injection.

## 2020-07-01 NOTE — MAU Note (Signed)
Gavin Pound CNM saw pt for MSE.

## 2020-07-01 NOTE — MAU Note (Signed)
Pt tol MTX well. Has scheduled appts for follow up in office. D/C ambulatory by herself

## 2020-07-05 DIAGNOSIS — O00101 Right tubal pregnancy without intrauterine pregnancy: Secondary | ICD-10-CM | POA: Diagnosis not present

## 2020-07-08 DIAGNOSIS — O00101 Right tubal pregnancy without intrauterine pregnancy: Secondary | ICD-10-CM | POA: Diagnosis not present

## 2020-07-09 DIAGNOSIS — O00101 Right tubal pregnancy without intrauterine pregnancy: Secondary | ICD-10-CM | POA: Diagnosis not present

## 2020-07-16 DIAGNOSIS — Z8759 Personal history of other complications of pregnancy, childbirth and the puerperium: Secondary | ICD-10-CM | POA: Diagnosis not present

## 2020-07-23 DIAGNOSIS — O00101 Right tubal pregnancy without intrauterine pregnancy: Secondary | ICD-10-CM | POA: Diagnosis not present

## 2020-08-13 DIAGNOSIS — O00101 Right tubal pregnancy without intrauterine pregnancy: Secondary | ICD-10-CM | POA: Diagnosis not present

## 2020-10-15 DIAGNOSIS — M545 Low back pain, unspecified: Secondary | ICD-10-CM | POA: Diagnosis not present

## 2020-10-15 DIAGNOSIS — M461 Sacroiliitis, not elsewhere classified: Secondary | ICD-10-CM | POA: Diagnosis not present

## 2020-10-15 DIAGNOSIS — Z3202 Encounter for pregnancy test, result negative: Secondary | ICD-10-CM | POA: Diagnosis not present

## 2020-10-15 DIAGNOSIS — R1031 Right lower quadrant pain: Secondary | ICD-10-CM | POA: Diagnosis not present

## 2020-11-17 ENCOUNTER — Encounter: Payer: Self-pay | Admitting: *Deleted

## 2021-05-24 ENCOUNTER — Telehealth: Payer: Self-pay

## 2021-05-24 NOTE — Telephone Encounter (Signed)
Pt called in stating that she has already had Titers done and those were the only ones that came back negative. Pt states she had the titers done on 05/12/21 at [facility]. She needs a script so she can have these done at the pharmacy.  Please advise

## 2021-05-24 NOTE — Telephone Encounter (Signed)
Caller name:Georgie Marcello Moores   On DPR? :Yes  Call back number:[phone]  Provider they see: Birdie Riddle   Reason for call:Pt is calling wants to get the MMR and the Versilia vaccine for school?

## 2021-05-24 NOTE — Telephone Encounter (Signed)
Prior to getting any vaccines she should have titers done to see if she is already immune (previous vaccinations or illness).  She needs a lab visit for varicella IgG and MMR titers (dx immunity status testing) °

## 2021-05-24 NOTE — Telephone Encounter (Signed)
Pt chart as well as her [facility] record shows no MMR previously given or Varicella vaccine.   We do not have the varicella vaccine in office would your recommendation for receiving this be to go to the health department?   As for MMR is this okay to schedule as a Nurse visit to be completed?

## 2021-05-25 NOTE — Telephone Encounter (Signed)
If she had titers done and has proof that she needs these immunizations, she just needs to go to the pharmacy or schedule online.  No prescription needed.

## 2021-05-25 NOTE — Telephone Encounter (Signed)
Pt called Brenda Freeman

## 2021-05-25 NOTE — Telephone Encounter (Signed)
Patient is aware 

## 2021-05-25 NOTE — Telephone Encounter (Signed)
Attempted to call, no answer and vm was full

## 2021-06-10 MED ORDER — MEASLES, MUMPS & RUBELLA VAC IJ SOLR: 0.5000 mL | Freq: Once | INTRAMUSCULAR | 0 refills | Status: AC

## 2021-06-10 MED ORDER — VARICELLA VIRUS VACCINE LIVE 1350 PFU/0.5ML IJ SUSR: 0.5000 mL | Freq: Once | INTRAMUSCULAR | 0 refills | Status: AC

## 2021-06-10 NOTE — Telephone Encounter (Signed)
Attempted to call patient to clarify and confirm pharmacy. Per Dr Birdie Riddle we have never had to send a rx for an immunization before. No answer and vm was full

## 2021-06-10 NOTE — Addendum Note (Signed)
Addended by: Midge Minium on: 06/10/2021 03:56 PM   Modules accepted: Orders

## 2021-06-10 NOTE — Telephone Encounter (Signed)
Pt was told by the pharmacy that she needs to have a prescription to get the immunization  Please advise   Pt can be reached at the cell #

## 2021-06-10 NOTE — Telephone Encounter (Signed)
Sent prescriptions for both vaccines to the pharmacy but she should only get the vaccines if she is NOT pregnant and NOT breast feeding

## 2021-06-13 NOTE — Telephone Encounter (Signed)
Attempted to call patient and make her aware. No answer and vm was full

## 2021-07-22 ENCOUNTER — Other Ambulatory Visit: Payer: Self-pay

## 2021-07-22 ENCOUNTER — Ambulatory Visit (LOCAL_COMMUNITY_HEALTH_CENTER): Payer: Medicaid Other

## 2021-07-22 DIAGNOSIS — Z719 Counseling, unspecified: Secondary | ICD-10-CM

## 2021-07-22 NOTE — Progress Notes (Signed)
Pt came in for Varicella and MMR vaccines required for school. Pt stated that Varicella and MMR titers showing negative results done at [facility] urgent care. Pulled out lab results in Epic, titers were done on 05/03/21 with negative results. Pt stated that she has somewhere to go to and can't stay for a visit today, advised pt that it shouldn't be long since titers copy is available for me to verify. Printed copies from nurse clinic, nurse came back out and pt has left. M. Kaylynne Andres,LPN ?

## 2021-08-17 ENCOUNTER — Encounter: Payer: Self-pay | Admitting: Registered Nurse

## 2021-08-17 ENCOUNTER — Ambulatory Visit: Payer: 59 | Admitting: Registered Nurse

## 2021-08-17 VITALS — BP 94/68 | HR 63 | Temp 97.7°F | Resp 18 | Ht 64.0 in | Wt 124.4 lb

## 2021-08-17 DIAGNOSIS — M79671 Pain in right foot: Secondary | ICD-10-CM | POA: Diagnosis not present

## 2021-08-17 DIAGNOSIS — H5310 Unspecified subjective visual disturbances: Secondary | ICD-10-CM

## 2021-08-17 MED ORDER — OLOPATADINE HCL 0.1 % OP SOLN: 1.0000 [drp] | Freq: Two times a day (BID) | OPHTHALMIC | 12 refills | Status: DC

## 2021-08-17 NOTE — Progress Notes (Signed)
? ?Acute Office Visit ? ?Subjective:  ? ? Patient ID: Ellisyn Icenhower, female    DOB: 02/05/1996, 26 y.o.   MRN: [id number] ? ?Chief Complaint  ?Patient presents with  ? Foot Pain  ?  Patient states she has been having some foot pain and also some left eye feels likes her eye is straining when she is looking at something both for about one month.   ? ? ?HPI ?Patient is in today for foot pain, eye strain ? ?Foot pain ?Onset one mo ago. R foot ?Underside of foot ?Worst in mornings and evenings.  ?Wears a variety of different shoes ?Has not taken medication ?No bruising or tenderness.  ? ?Eye strain ?L eye. Onset one mo ago.  ?For a few months, eye has been twitching. ?Then it stopped - only happens every now and then  ?Notes a lot of screen time due to work. ?Uses blue light glasses. ? ? ?No outpatient medications prior to visit.  ? ?No facility-administered medications prior to visit.  ? ? ?Review of Systems  ?Constitutional: Negative.   ?HENT: Negative.    ?Eyes: Negative.   ?Respiratory: Negative.    ?Cardiovascular: Negative.   ?Gastrointestinal: Negative.   ?Genitourinary: Negative.   ?Musculoskeletal: Negative.   ?Skin: Negative.   ?Neurological: Negative.   ?Psychiatric/Behavioral: Negative.    ?All other systems reviewed and are negative. ? ?   ?Objective:  ?  ?BP 94/68   Pulse 63   Temp 97.7 ?F (36.5 ?C) (Temporal)   Resp 18   Ht '5\' 4"'$  (1.626 m)   Wt 124 lb 6.4 oz (56.4 kg)   SpO2 100%   BMI 21.35 kg/m?  ?Physical Exam ?Vitals and nursing note reviewed.  ?Constitutional:   ?   General: She is not in acute distress. ?   Appearance: Normal appearance. She is normal weight. She is not ill-appearing, toxic-appearing or diaphoretic.  ?Eyes:  ?   General: No scleral icterus.    ?   Right eye: No discharge.     ?   Left eye: No discharge.  ?   Extraocular Movements: Extraocular movements intact.  ?   Conjunctiva/sclera: Conjunctivae normal.  ?   Pupils: Pupils are equal, round, and reactive to light.  ?    Funduscopic exam: ?   Right eye: No hemorrhage, exudate, AV nicking, arteriolar narrowing or papilledema. Red reflex and venous pulsations present.     ?   Left eye: No hemorrhage, exudate, AV nicking, arteriolar narrowing or papilledema. Red reflex and venous pulsations present. ?   Comments: Floaters noted on exam  ?Cardiovascular:  ?   Rate and Rhythm: Normal rate and regular rhythm.  ?   Heart sounds: Normal heart sounds. No murmur heard. ?  No friction rub. No gallop.  ?Pulmonary:  ?   Effort: Pulmonary effort is normal. No respiratory distress.  ?   Breath sounds: Normal breath sounds. No stridor. No wheezing, rhonchi or rales.  ?Chest:  ?   Chest wall: No tenderness.  ?Skin: ?   General: Skin is warm and dry.  ?Neurological:  ?   General: No focal deficit present.  ?   Mental Status: She is alert and oriented to person, place, and time. Mental status is at baseline.  ?Psychiatric:     ?   Mood and Affect: Mood normal.     ?   Behavior: Behavior normal.     ?   Thought Content: Thought content normal.     ?  Judgment: Judgment normal.  ? ? ?No results found for any visits on 08/17/21. ? ? ?   ?Assessment & Plan:  ?1. Right foot pain ?- Ambulatory referral to Podiatry ? ?2. Left eye strain ?- olopatadine (PATANOL) 0.1 % ophthalmic solution; Place 1 drop into both eyes 2 (two) times daily.  Dispense: 5 mL; Refill: 12 ?- Ambulatory referral to Ophthalmology ? ? ? ?Meds ordered this encounter  ?Medications  ? olopatadine (PATANOL) 0.1 % ophthalmic solution  ?  Sig: Place 1 drop into both eyes 2 (two) times daily.  ?  Dispense:  5 mL  ?  Refill:  12  ?  Order Specific Question:   Supervising Provider  ?  Answer:   Carlota Raspberry, JEFFREY R [[id number]]  ? ? ?Return if symptoms worsen or fail to improve. ? ?PLAN ?Foot pain - bruising from stepping on toys vs. Plantar fasciitis. Reviewed supportive shoes and OTC relief. Will refer to podiatry for further work up.  ?Eye seems as strain vs allergies. Will refer to  ophthalmology ?Patient encouraged to call clinic with any questions, comments, or concerns. ? ?Maximiano Coss, NP ?

## 2021-08-17 NOTE — Patient Instructions (Signed)
Brenda Freeman -  ? ?Great to meet you ? ?Call with concerns ? ?Referrals placed - I will follow along from here. ? ?Thanks, ? ?Rich  ?

## 2021-08-19 ENCOUNTER — Other Ambulatory Visit: Payer: Self-pay | Admitting: Registered Nurse

## 2021-08-19 ENCOUNTER — Telehealth: Payer: Self-pay | Admitting: Registered Nurse

## 2021-08-19 DIAGNOSIS — H5310 Unspecified subjective visual disturbances: Secondary | ICD-10-CM

## 2021-08-19 NOTE — Telephone Encounter (Signed)
[facility] called and stated that they do not accept her insurance. Wanted to make [place] aware so another referral could be placed ?

## 2021-08-19 NOTE — Telephone Encounter (Signed)
No  new referral is needed can update the referral, I have LM asking pt to let us know if she has medicaid as a secondary insurance [employer]   ?

## 2021-08-19 NOTE — Telephone Encounter (Signed)
Have placed new referral ? ?Thanks, ? ?Rich

## 2021-08-19 NOTE — Telephone Encounter (Signed)
Noted  Thanks,  Rich

## 2021-08-30 ENCOUNTER — Encounter: Payer: Self-pay | Admitting: Podiatry

## 2021-08-30 ENCOUNTER — Ambulatory Visit: Payer: 59 | Admitting: Podiatry

## 2021-08-30 DIAGNOSIS — Q666 Other congenital valgus deformities of feet: Secondary | ICD-10-CM

## 2021-08-30 NOTE — Progress Notes (Signed)
?  Subjective:  ?Patient ID: Brenda Freeman, female    DOB: 1996/02/07,  MRN: [id number] ? ?Chief Complaint  ?Patient presents with  ? Foot Pain  ?  Patient presents with right foot arch pain x 2-3 months  ? ? ?26 y.o. female presents with the above complaint.  Patient presents with complaint of arch pain that has been going for past 2 to 3 months.  She does do a lot of walking on her foot.  She does not wear good shoes.  She does not wear any kind of orthotics.  She has not seen anyone else prior to seeing me.  She would like to discuss treatment options for it.  She does not have any heel pain/Planter fasciitis pain.  Nothing has helped with the pain except taking weight off of the feet.  She denies any other acute issues.  Right is much greater than left side. ? ? ?Review of Systems: Negative except as noted in the HPI. Denies N/V/F/Ch. ? ?Past Medical History:  ?Diagnosis Date  ? Allergy   ? Chicken pox   ? Frequent headaches   ? ? ?Current Outpatient Medications:  ?  olopatadine (PATANOL) 0.1 % ophthalmic solution, Place 1 drop into both eyes 2 (two) times daily., Disp: 5 mL, Rfl: 12 ? ?Social History  ? ?Tobacco Use  ?Smoking Status Never  ?Smokeless Tobacco Never  ? ? ?No Known Allergies ?Objective:  ?There were no vitals filed for this visit. ?There is no height or weight on file to calculate BMI. ?Constitutional Well developed. ?Well nourished.  ?Vascular Dorsalis pedis pulses palpable bilaterally. ?Posterior tibial pulses palpable bilaterally. ?Capillary refill normal to all digits.  ?No cyanosis or clubbing noted. ?Pedal hair growth normal.  ?Neurologic Normal speech. ?Oriented to person, place, and time. ?Epicritic sensation to light touch grossly present bilaterally.  ?Dermatologic Nails well groomed and normal in appearance. ?No open wounds. ?No skin lesions.  ?Orthopedic: Gait examination shows pes planovalgus foot structure with calcaneovalgus to many toe signs partially able to recruit the arch with  dorsiflexion of the hallux.  Return of calcaneus to neutral position and single and double heel raise  ? ?Radiographs: None ?Assessment:  ? ?1. Pes planovalgus   ? ?Plan:  ?Patient was evaluated and treated and all questions answered. ? ?Pes planovalgus ?-I explained to patient the etiology of pes planovalgus and relationship with Planter fasciitis/arch pain and various treatment options were discussed.  Given patient foot structure in the setting of Planter fasciitis/arch pain I believe patient will benefit from custom-made orthotics to help control the hindfoot motion support the arch of the foot and take the stress away from plantar fascia/arch pain.  Patient agrees with the plan like to proceed with orthotics ?-She was will be scheduled to see Aaron Edelman for orthotics cast ? ? ?No follow-ups on file.  ?

## 2021-09-23 ENCOUNTER — Other Ambulatory Visit: Payer: 59

## 2022-05-01 ENCOUNTER — Ambulatory Visit: Payer: 59 | Admitting: Family Medicine

## 2022-05-01 ENCOUNTER — Encounter: Payer: Self-pay | Admitting: Family Medicine

## 2022-05-01 VITALS — BP 118/78 | HR 125 | Temp 98.7°F | Resp 16 | Ht 64.0 in | Wt 123.1 lb

## 2022-05-01 DIAGNOSIS — R300 Dysuria: Secondary | ICD-10-CM | POA: Diagnosis not present

## 2022-05-01 LAB — POCT URINALYSIS DIPSTICK
Glucose, UA: NEGATIVE
Leukocytes, UA: NEGATIVE
Protein, UA: NEGATIVE
Spec Grav, UA: 1.01 (ref 1.010–1.025)
Urobilinogen, UA: 0.2 E.U./dL
pH, UA: 6.5 (ref 5.0–8.0)

## 2022-05-01 MED ORDER — CEPHALEXIN 500 MG PO CAPS: 500.0000 mg | ORAL_CAPSULE | Freq: Two times a day (BID) | ORAL | 0 refills | Status: AC

## 2022-05-01 NOTE — Patient Instructions (Signed)
Follow up as needed or as scheduled START the Cephalexin twice daily x5 days Drink LOTS of water to flush through the kidneys and the bladder Call with any questions or concerns Stay Safe!  Stay Healthy! Happy Holidays!!!

## 2022-05-01 NOTE — Progress Notes (Signed)
   Subjective:    Patient ID: Brenda Freeman, female    DOB: 01/01/1996, 26 y.o.   MRN: [id number]  HPI Burning w/ urination- sxs started Friday w/ urgency.  When she used the bathroom she had burning at the end of urination.  Also noted blood.  Not having menstrual cycle.  Increased frequency.  Some suprapubic discomfort.  Some mild low back pain.  No fevers.  No N/V.  Initially had increased frequency 2 weeks ago.   Review of Systems     Objective:   Physical Exam Vitals reviewed.  Constitutional:      General: She is not in acute distress.    Appearance: Normal appearance. She is well-developed. She is not ill-appearing.  Abdominal:     General: There is no distension.     Palpations: Abdomen is soft.     Tenderness: There is no abdominal tenderness (no suprapubic or CVA tenderness).  Neurological:     General: No focal deficit present.     Mental Status: She is alert and oriented to person, place, and time.  Psychiatric:        Mood and Affect: Mood normal.        Behavior: Behavior normal.        Thought Content: Thought content normal.           Assessment & Plan:  Burning w/ urination- new.  Pt's sxs are consistent w/ infxn and UA shows blood.  Will start Keflex while waiting on culture results and will adjust meds prn.  Pt expressed understanding and is in agreement w/ plan.

## 2022-05-02 ENCOUNTER — Telehealth: Payer: Self-pay | Admitting: Family Medicine

## 2022-05-02 NOTE — Telephone Encounter (Signed)
Informed pt that her urine culture has not came back at this time as soon as Dr Birdie Riddle reviews it we call her she expressed verbal understanding

## 2022-05-02 NOTE — Telephone Encounter (Signed)
error 

## 2022-05-02 NOTE — Telephone Encounter (Signed)
Caller name: Felise Georgia  On DPR?: Yes  Call back number: [phone] (mobile)  Provider they see: Midge Minium, MD  Reason for call: Pt called to get her lab results.

## 2022-05-03 ENCOUNTER — Telehealth: Payer: Self-pay

## 2022-05-03 LAB — URINE CULTURE
MICRO NUMBER:: 14301192
SPECIMEN QUALITY:: ADEQUATE

## 2022-05-03 NOTE — Telephone Encounter (Signed)
Informed pt of lab results  

## 2022-05-03 NOTE — Telephone Encounter (Signed)
-----   Message from Midge Minium, MD sent at 05/03/2022  3:48 PM EST ----- Your UTI is being treated appropriately.  I hope you are feeling better!

## 2022-05-04 ENCOUNTER — Encounter: Payer: Self-pay | Admitting: Family Medicine

## 2022-05-05 MED ORDER — FLUCONAZOLE 150 MG PO TABS
150.0000 mg | ORAL_TABLET | Freq: Once | ORAL | 0 refills | Status: AC
Start: 2022-05-05 — End: 2022-05-05

## 2022-05-08 ENCOUNTER — Encounter: Payer: Self-pay | Admitting: Family Medicine

## 2022-05-11 ENCOUNTER — Telehealth: Payer: Self-pay

## 2022-05-11 ENCOUNTER — Ambulatory Visit: Payer: 59 | Admitting: Family Medicine

## 2022-05-11 ENCOUNTER — Encounter: Payer: Self-pay | Admitting: Family Medicine

## 2022-05-11 VITALS — BP 116/72 | HR 84 | Temp 97.4°F | Resp 16 | Ht 64.0 in | Wt 122.2 lb

## 2022-05-11 DIAGNOSIS — N926 Irregular menstruation, unspecified: Secondary | ICD-10-CM | POA: Diagnosis not present

## 2022-05-11 DIAGNOSIS — R1011 Right upper quadrant pain: Secondary | ICD-10-CM | POA: Diagnosis not present

## 2022-05-11 LAB — CBC WITH DIFFERENTIAL/PLATELET
Basophils Absolute: 0 10*3/uL (ref 0.0–0.1)
Basophils Relative: 0.4 % (ref 0.0–3.0)
Eosinophils Absolute: 0 10*3/uL (ref 0.0–0.7)
Eosinophils Relative: 1.2 % (ref 0.0–5.0)
HCT: 36.8 % (ref 36.0–46.0)
Hemoglobin: 12.2 g/dL (ref 12.0–15.0)
Lymphocytes Relative: 53.7 % — ABNORMAL HIGH (ref 12.0–46.0)
Lymphs Abs: 1.9 10*3/uL (ref 0.7–4.0)
MCHC: 33.2 g/dL (ref 30.0–36.0)
MCV: 79.9 fl (ref 78.0–100.0)
Monocytes Absolute: 0.3 10*3/uL (ref 0.1–1.0)
Monocytes Relative: 7.3 % (ref 3.0–12.0)
Neutro Abs: 1.3 10*3/uL — ABNORMAL LOW (ref 1.4–7.7)
Neutrophils Relative %: 37.4 % — ABNORMAL LOW (ref 43.0–77.0)
Platelets: 238 10*3/uL (ref 150.0–400.0)
RBC: 4.61 Mil/uL (ref 3.87–5.11)
RDW: 13.1 % (ref 11.5–15.5)
WBC: 3.5 10*3/uL — ABNORMAL LOW (ref 4.0–10.5)

## 2022-05-11 LAB — HEPATIC FUNCTION PANEL
ALT: 14 U/L (ref 0–35)
AST: 15 U/L (ref 0–37)
Albumin: 4.6 g/dL (ref 3.5–5.2)
Alkaline Phosphatase: 48 U/L (ref 39–117)
Bilirubin, Direct: 0.1 mg/dL (ref 0.0–0.3)
Total Bilirubin: 0.7 mg/dL (ref 0.2–1.2)
Total Protein: 7.4 g/dL (ref 6.0–8.3)

## 2022-05-11 LAB — BASIC METABOLIC PANEL
BUN: 14 mg/dL (ref 6–23)
CO2: 28 mEq/L (ref 19–32)
Calcium: 9.5 mg/dL (ref 8.4–10.5)
Chloride: 104 mEq/L (ref 96–112)
Creatinine, Ser: 0.83 mg/dL (ref 0.40–1.20)
GFR: 95.78 mL/min (ref >60.00)
Glucose, Bld: 77 mg/dL (ref 70–99)
Potassium: 4.5 mEq/L (ref 3.5–5.1)
Sodium: 140 mEq/L (ref 135–145)

## 2022-05-11 LAB — POCT URINE PREGNANCY: Preg Test, Ur: NEGATIVE

## 2022-05-11 LAB — HCG, QUANTITATIVE, PREGNANCY: Quantitative HCG: <0.6 m[IU]/mL

## 2022-05-11 NOTE — Telephone Encounter (Signed)
Informed pt of lab results  

## 2022-05-11 NOTE — Progress Notes (Signed)
   Subjective:    Patient ID: Brenda Freeman, female    DOB: 1996/04/29, 26 y.o.   MRN: [id number]  HPI Irregular menses- pt reports she did not have a cycle from [place].  Had a cycle in August but did not have another cycle until starting abx this month.  Pt reports she hasn't been on birth control since 2016.  Pt reports currently feels like she's having a normal cycle.  Today is day 4.  Pt has a GYN- Dr Lisbeth Renshaw.    RUQ pain- sxs started 5 days ago.  Initially was painful to move.  Remains TTP.  Sxs are better than they were but still present.  No fevers.  No N/V.  Review of Systems For ROS see HPI     Objective:   Physical Exam Vitals reviewed.  Constitutional:      General: She is not in acute distress.    Appearance: Normal appearance. She is not ill-appearing.  HENT:     Head: Normocephalic and atraumatic.  Neck:     Comments: No thyromegaly or tenderness Cardiovascular:     Rate and Rhythm: Normal rate and regular rhythm.  Pulmonary:     Effort: Pulmonary effort is normal. No respiratory distress.     Breath sounds: Normal breath sounds. No wheezing or rhonchi.  Abdominal:     General: There is no distension.     Palpations: Abdomen is soft.     Tenderness: There is abdominal tenderness (RUQ). There is no guarding or rebound.  Musculoskeletal:     Cervical back: Normal range of motion and neck supple.  Skin:    General: Skin is warm and dry.  Neurological:     General: No focal deficit present.     Mental Status: She is alert and oriented to person, place, and time.  Psychiatric:        Mood and Affect: Mood normal.        Behavior: Behavior normal.        Thought Content: Thought content normal.           Assessment & Plan:  Irregular menses/missed period- new.  Pt had not had a period since August.  Recently started menstruating after starting abx.  Upreg is negative today.  Will get beta HCG to be sure she does not have an ectopic due to RUQ pain.  Encouraged  her to schedule w/ GYN for complete evaluation.  Pt expressed understanding and is in agreement w/ plan.   RUQ pain- new.  No N/V.  No fevers.  Discomfort w/ certain movements which is suspicious for musculoskeletal etiology but will get labs to assess biliary issue or infxn.  Reviewed supportive care and red flags that should prompt return.  Pt expressed understanding and is in agreement w/ plan.

## 2022-05-11 NOTE — Patient Instructions (Addendum)
Follow up as needed or as scheduled We'll notify you of your lab results and make any changes if needed Call and schedule an appt w/ GYN Your abdominal pain may be a pulled muscle- try tylenol or ibuprofen for relief If your abdominal pain changes or worsens- please let us know or go to the ER for evaluation Call with any questions or concerns Stay Safe!  Stay Healthy! Happy Holidays!!!

## 2022-05-11 NOTE — Telephone Encounter (Signed)
-----   Message from Midge Minium, MD sent at 05/11/2022  3:24 PM EST ----- Labs look great!  No evidence of liver/gallbladder abnormality or pregnancy

## 2022-05-12 LAB — URINE CULTURE
MICRO NUMBER:: 14345848
Result:: NO GROWTH
SPECIMEN QUALITY:: ADEQUATE

## 2022-11-01 LAB — HM PAP SMEAR

## 2023-03-26 ENCOUNTER — Encounter: Payer: Self-pay | Admitting: Family Medicine

## 2023-11-12 ENCOUNTER — Telehealth: Payer: Self-pay | Admitting: Family Medicine

## 2023-11-12 NOTE — Telephone Encounter (Signed)
 Copied from CRM [id number]. Topic: Appointments - Scheduling Inquiry for Clinic >> Nov 12, 2023  2:54 PM [place] J wrote:   Reason for CRM: Patient would like to schedule an appointment for a tetanus injection. Patient states that she needs to be seen this week for it.

## 2023-11-12 NOTE — Telephone Encounter (Signed)
 Patient is 2 years overdue for an office visit, will need OV in order to have this done.

## 2023-11-13 ENCOUNTER — Ambulatory Visit: Admitting: Family Medicine

## 2023-11-13 ENCOUNTER — Telehealth: Payer: Self-pay

## 2023-11-13 NOTE — Telephone Encounter (Signed)
 Patient brought in forms to be completed, Immunization verification form, placed in providers sign folder for review, printed [facility] to go with it.   Pt would like a call or a MyChart message letting her know when this is complete

## 2023-11-13 NOTE — Telephone Encounter (Signed)
 Patient has been scheduled for today 6/24

## 2023-11-14 ENCOUNTER — Encounter: Payer: Self-pay | Admitting: Family Medicine

## 2023-11-15 NOTE — Telephone Encounter (Addendum)
 Forms received and mychart message sent to patient.

## 2023-11-15 NOTE — Telephone Encounter (Signed)
 Form completed to best of my ability and returned to Barrett.  Office stamp needed on page 3

## 2023-11-22 ENCOUNTER — Telehealth: Payer: Self-pay

## 2023-11-22 NOTE — Telephone Encounter (Unsigned)
 Copied from CRM [id number]. Topic: Clinical - Request for Lab/Test Order >> Nov 22, 2023  1:28 PM Geroldine GRADE wrote: Reason for CRM: Patient is calling to get an order for full work up She wants a Iron , TIBC, and Ferritin Panel  Check b12, vitamin d, thyroid ,   She also wants a Micronutrient Test

## 2023-11-22 NOTE — Telephone Encounter (Signed)
 Copied from CRM [id number]. Topic: Clinical - Request for Lab/Test Order >> Nov 22, 2023  1:28 PM Geroldine GRADE wrote: Reason for CRM: Patient is calling to get an order for full work up She wants a Iron , TIBC, and Ferritin Panel  Check b12, vitamin d, thyroid ,   She also wants a Micronutrient Test

## 2024-01-09 ENCOUNTER — Encounter: Payer: Self-pay | Admitting: Family Medicine

## 2024-01-09 ENCOUNTER — Ambulatory Visit (INDEPENDENT_AMBULATORY_CARE_PROVIDER_SITE_OTHER): Admitting: Family Medicine

## 2024-01-09 VITALS — BP 106/82 | HR 70 | Temp 98.1°F | Resp 11 | Ht 65.45 in | Wt 131.6 lb

## 2024-01-09 DIAGNOSIS — Z1159 Encounter for screening for other viral diseases: Secondary | ICD-10-CM

## 2024-01-09 DIAGNOSIS — Z0001 Encounter for general adult medical examination with abnormal findings: Secondary | ICD-10-CM | POA: Diagnosis not present

## 2024-01-09 DIAGNOSIS — Z13220 Encounter for screening for lipoid disorders: Secondary | ICD-10-CM | POA: Diagnosis not present

## 2024-01-09 DIAGNOSIS — N912 Amenorrhea, unspecified: Secondary | ICD-10-CM

## 2024-01-09 DIAGNOSIS — Z0000 Encounter for general adult medical examination without abnormal findings: Secondary | ICD-10-CM

## 2024-01-09 LAB — LIPID PANEL
Cholesterol: 155 mg/dL (ref 0–200)
HDL: 66.3 mg/dL (ref >39.00)
LDL Cholesterol: 80 mg/dL (ref 0–99)
NonHDL: 88.96
Total CHOL/HDL Ratio: 2
Triglycerides: 46 mg/dL (ref 0.0–149.0)
VLDL: 9.2 mg/dL (ref 0.0–40.0)

## 2024-01-09 LAB — TIQ-MISC

## 2024-01-09 LAB — UNLABELED: Test Ordered On Req: 10187

## 2024-01-09 LAB — BASIC METABOLIC PANEL WITH GFR
BUN: 9 mg/dL (ref 6–23)
CO2: 31 meq/L (ref 19–32)
Calcium: 9.3 mg/dL (ref 8.4–10.5)
Chloride: 103 meq/L (ref 96–112)
Creatinine, Ser: 0.77 mg/dL (ref 0.40–1.20)
GFR: 103.58 mL/min (ref >60.00)
Glucose, Bld: 77 mg/dL (ref 70–99)
Potassium: 3.8 meq/L (ref 3.5–5.1)
Sodium: 140 meq/L (ref 135–145)

## 2024-01-09 LAB — CBC WITH DIFFERENTIAL/PLATELET
Basophils Absolute: 0 K/uL (ref 0.0–0.1)
Basophils Relative: 0.4 % (ref 0.0–3.0)
Eosinophils Absolute: 0 K/uL (ref 0.0–0.7)
Eosinophils Relative: 1.1 % (ref 0.0–5.0)
HCT: 38.8 % (ref 36.0–46.0)
Hemoglobin: 12.7 g/dL (ref 12.0–15.0)
Lymphocytes Relative: 50.8 % — ABNORMAL HIGH (ref 12.0–46.0)
Lymphs Abs: 1.4 K/uL (ref 0.7–4.0)
MCHC: 32.8 g/dL (ref 30.0–36.0)
MCV: 80 fl (ref 78.0–100.0)
Monocytes Absolute: 0.2 K/uL (ref 0.1–1.0)
Monocytes Relative: 6.6 % (ref 3.0–12.0)
Neutro Abs: 1.1 K/uL — ABNORMAL LOW (ref 1.4–7.7)
Neutrophils Relative %: 41.1 % — ABNORMAL LOW (ref 43.0–77.0)
Platelets: 196 K/uL (ref 150.0–400.0)
RBC: 4.85 Mil/uL (ref 3.87–5.11)
RDW: 13.3 % (ref 11.5–15.5)
WBC: 2.8 K/uL — ABNORMAL LOW (ref 4.0–10.5)

## 2024-01-09 LAB — HEPATIC FUNCTION PANEL
ALT: 16 U/L (ref 0–35)
AST: 17 U/L (ref 0–37)
Albumin: 4.6 g/dL (ref 3.5–5.2)
Alkaline Phosphatase: 57 U/L (ref 39–117)
Bilirubin, Direct: 0.2 mg/dL (ref 0.0–0.3)
Total Bilirubin: 1.1 mg/dL (ref 0.2–1.2)
Total Protein: 7.8 g/dL (ref 6.0–8.3)

## 2024-01-09 LAB — TSH: TSH: 1.27 u[IU]/mL (ref 0.35–5.50)

## 2024-01-09 LAB — HCG, QUANTITATIVE, PREGNANCY: Quantitative HCG: <0.6 m[IU]/mL

## 2024-01-09 NOTE — Progress Notes (Unsigned)
   Subjective:    Patient ID: Brenda Freeman, female    DOB: Nov 28, 1995, 28 y.o.   MRN: [id number]  HPI CPE- UTD on pap, Tdap.  Patient Care Team    Relationship Specialty Notifications Start End  Mahlon Comer BRAVO, MD PCP - General Family Medicine  01/09/18   Barbette Knock, MD Consulting Physician Obstetrics and Gynecology  01/09/18   Dr. Chesley  Dentistry  01/09/18       Review of Systems Patient reports no vision/ hearing changes, adenopathy,fever, weight change,  persistant/recurrent hoarseness , swallowing issues, chest pain, palpitations, edema, persistant/recurrent cough, hemoptysis, dyspnea (rest/exertional/paroxysmal nocturnal), gastrointestinal bleeding (melena, rectal bleeding), abdominal pain, significant heartburn, bowel changes, GU symptoms (dysuria, hematuria, incontinence),  syncope, focal weakness, memory loss, numbness & tingling, skin/hair/nail changes, abnormal bruising or bleeding, anxiety, or depression.   + amenorrhea since April- will have cramping, some skin breakouts around her usual cycle schedule but no bleeding.  Not currently on birth control.  No family hx of early menopause.    Objective:   Physical Exam General Appearance:    Alert, cooperative, no distress, appears stated age  Head:    Normocephalic, without obvious abnormality, atraumatic  Eyes:    PERRL, conjunctiva/corneas clear, EOM's intact both eyes  Ears:    Normal TM's and external ear canals, both ears  Nose:   Nares normal, septum midline, mucosa normal, no drainage    or sinus tenderness  Throat:   Lips, mucosa, and tongue normal; teeth and gums normal  Neck:   Supple, symmetrical, trachea midline, no adenopathy;    Thyroid : no enlargement/tenderness/nodules  Back:     Symmetric, no curvature, ROM normal, no CVA tenderness  Lungs:     Clear to auscultation bilaterally, respirations unlabored  Chest Wall:    No tenderness or deformity   Heart:    Regular rate and rhythm, S1 and S2 normal, no  murmur, rub   or gallop  Breast Exam:    Deferred to GYN  Abdomen:     Soft, non-tender, bowel sounds active all four quadrants,    no masses, no organomegaly  Genitalia:    Deferred to GYN  Rectal:    Extremities:   Extremities normal, atraumatic, no cyanosis or edema  Pulses:   2+ and symmetric all extremities  Skin:   Skin color, texture, turgor normal, no rashes or lesions  Lymph nodes:   Cervical, supraclavicular, and axillary nodes normal  Neurologic:   CNII-XII intact, normal strength, sensation and reflexes    throughout          Assessment & Plan:

## 2024-01-09 NOTE — Patient Instructions (Signed)
Follow up in 1 year or as needed We'll notify you of your lab results and make any changes if needed Keep up the good work on healthy diet and regular exercise- you look great!! Call with any questions or concerns Stay Safe!  Stay Healthy! Enjoy the rest of your summer!!!

## 2024-01-10 ENCOUNTER — Telehealth: Payer: Self-pay

## 2024-01-10 ENCOUNTER — Ambulatory Visit: Payer: Self-pay | Admitting: Family Medicine

## 2024-01-10 ENCOUNTER — Encounter: Payer: Self-pay | Admitting: Obstetrics & Gynecology

## 2024-01-10 DIAGNOSIS — N912 Amenorrhea, unspecified: Secondary | ICD-10-CM

## 2024-01-10 DIAGNOSIS — R7989 Other specified abnormal findings of blood chemistry: Secondary | ICD-10-CM

## 2024-01-10 LAB — MICRONUTRIENTS, ANTIOXIDANTS PANEL

## 2024-01-10 LAB — FSH/LH
FSH: 8.7 m[IU]/mL
LH: 7.4 m[IU]/mL

## 2024-01-10 LAB — HEPATITIS C ANTIBODY: Hepatitis C Ab: NONREACTIVE

## 2024-01-10 LAB — PROLACTIN: Prolactin: 49.9 ng/mL — ABNORMAL HIGH

## 2024-01-10 NOTE — Telephone Encounter (Signed)
 I will report this error. Please call the number in the original message to clarify and moving forward, be sure to call and clarify these type of messages before sending to a provider.

## 2024-01-10 NOTE — Telephone Encounter (Signed)
 Please see the message below about lab rejection

## 2024-01-10 NOTE — Telephone Encounter (Signed)
 Copied from CRM [id number]. Topic: General - Other >> Jan 10, 2024 12:24 PM Chiquita SQUIBB wrote: Reason for CRM: Corean from [facility] is calling in regarding the Coenzyme Q 10 test. She stated the name in the bag does not have a name on it, the sample was not protected by light, and also the tube was marked so they do not know what it is in the tube. The sample would need re collected. A good call back number is [phone] Reference number [id number]

## 2024-01-10 NOTE — Telephone Encounter (Signed)
 Patient has questions regarding lab results. Please see message below.

## 2024-01-10 NOTE — Telephone Encounter (Signed)
 This message doesn't make any sense- 'the name in the bag does not have a name on it'.  'the tube was marked so they do not know what it is in the tube'.  I'm not sure what message is trying to be conveyed based on this information

## 2024-01-10 NOTE — Assessment & Plan Note (Signed)
Pt's PE WNL.  UTD on pap, Tdap.  Check labs.  Anticipatory guidance provided.  

## 2024-01-11 NOTE — Telephone Encounter (Unsigned)
 Copied from CRM #[id number]. Topic: Clinical - Request for Lab/Test Order >> Jan 11, 2024  4:46 PM Chiquita SQUIBB wrote: Reason for CRM: Patient is calling in to schedule the lab work that was exposed to light  that they could not accept. No orders have been placed per Meighan's note I did not order the lab as a future order b/c I am waiting for patient to reply. Patient is ready to schedule.

## 2024-01-11 NOTE — Telephone Encounter (Unsigned)
 Copied from CRM [id number]. Topic: General - Other >> Jan 10, 2024 12:24 PM Chiquita SQUIBB wrote: Reason for CRM: Brenda Freeman from [facility] is calling in regarding the Coenzyme Q 10 test. She stated the name in the bag does not have a name on it, the sample was not protected by light, and also the tube was marked so they do not know what it is in the tube. The sample would need re collected. A good call back number is [phone] Reference number [id number] >> Jan 11, 2024 11:15 AM Brenda Freeman wrote: Pt is following up on the labs that were done. She says there was an issue with the name on the bag and she would like to know what she need to do. She can be reached via My Chart

## 2024-01-11 NOTE — Telephone Encounter (Unsigned)
 Copied from CRM [id number]. Topic: General - Other >> Jan 11, 2024 11:15 AM Mia F wrote: Pt is following up on the labs that were done. She says there was an issue with the name on the bag and she would like to know what she need to do. She can be reached via My Chart

## 2024-01-11 NOTE — Telephone Encounter (Signed)
 Sent mychart message letting patient know that there was an error in the lab and if she would like to have the lab redrawn to let me know and I can get that appt scheduled.    I did not order the lab as a future order b/c I am waiting for patient to reply.

## 2024-01-11 NOTE — Telephone Encounter (Signed)
 We will need to tell pt that there was a lab error an her micronutrient panel was not able to be run.  It is up to her if she would like to come back next week (or whenever is convenient for her) to have this redrawn

## 2024-01-11 NOTE — Telephone Encounter (Signed)
 Per lab, they called [facility] and asked what happened this morning. They stated the specimen was not protected from light when it was, and the label on the specimen tube was off the tube and in the bag. [facility] also told lab that the lab order was canceled by provider. How would you like to proceed?

## 2024-01-14 ENCOUNTER — Encounter: Admitting: Family Medicine

## 2024-01-14 NOTE — Addendum Note (Signed)
 Addended by: Adrijana Haros K on: 01/14/2024 10:20 AM   Modules accepted: Orders

## 2024-01-14 NOTE — Telephone Encounter (Signed)
 Re ordered lab for future, called patient to schedule lab, scheduled for Tuesday 01/15/2024

## 2024-01-15 ENCOUNTER — Other Ambulatory Visit

## 2024-01-15 DIAGNOSIS — N912 Amenorrhea, unspecified: Secondary | ICD-10-CM

## 2024-01-26 ENCOUNTER — Ambulatory Visit (HOSPITAL_COMMUNITY)
Admission: RE | Admit: 2024-01-26 | Discharge: 2024-01-26 | Disposition: A | Discharge status: Home / Self Care | Source: Ambulatory Visit | Attending: Family Medicine | Admitting: Family Medicine

## 2024-01-26 DIAGNOSIS — N912 Amenorrhea, unspecified: Secondary | ICD-10-CM | POA: Diagnosis not present

## 2024-01-26 DIAGNOSIS — R7989 Other specified abnormal findings of blood chemistry: Secondary | ICD-10-CM

## 2024-01-26 LAB — MICRONUTRIENTS, ANTIOXIDANTS PANEL
COENZYME Q10(COQ10): 0.56 ug/mL (ref >0.35)
VITAMIN E, ALPHA TOCOPHEROL: 12.6 mg/L (ref 5.7–19.9)
VITAMIN E, BETA GAMMA TOCOPHEROL: <1 mg/L (ref <4.4)
Vitamin A: 33 ug/dL — ABNORMAL LOW (ref 38–98)

## 2024-01-26 MED ORDER — GADOBUTROL 1 MMOL/ML IV SOLN
5.9000 mL | Freq: Once | INTRAVENOUS | Status: AC | PRN
  Administered 2024-01-26: 5.9 mL via INTRAVENOUS

## 2024-01-28 ENCOUNTER — Ambulatory Visit: Payer: Self-pay | Admitting: Family Medicine

## 2024-01-30 ENCOUNTER — Ambulatory Visit: Payer: Self-pay | Admitting: Family Medicine

## 2024-01-30 DIAGNOSIS — R7989 Other specified abnormal findings of blood chemistry: Secondary | ICD-10-CM

## 2024-01-30 DIAGNOSIS — D352 Benign neoplasm of pituitary gland: Secondary | ICD-10-CM

## 2024-01-30 DIAGNOSIS — N912 Amenorrhea, unspecified: Secondary | ICD-10-CM

## 2024-02-19 NOTE — Telephone Encounter (Signed)
 Copied from CRM [id number]. Topic: General - Other >> Feb 19, 2024  4:18 PM [name] wrote: Reason for CRM: [facility] is calling to get the pt's demographics and an updated insurance card for the pt. They stated this information is needed for the pt's referral for endo. Fax [phone].

## 2024-02-19 NOTE — Telephone Encounter (Signed)
 Forms have been faxed

## 2024-02-20 ENCOUNTER — Telehealth: Payer: Self-pay

## 2024-02-20 NOTE — Telephone Encounter (Signed)
 [facility] end[facility] cannot take patient due to her insurance. Patient is needing to be sent to another office that accepts Medicaid.   Copied from CRM #[id number]. Topic: Referral - Status >> Feb 20, 2024  8:12 AM Carlyon D wrote: Reason for CRM:   Wandalee with [facility] for Endocrinology. Pt can not be seen by them due to the insurance pt has they do not accept Medicaid. Please refer pt to another endocrinologist that accepts pt insurance.

## 2024-02-22 NOTE — Telephone Encounter (Signed)
 Tobias,   Are you aware of any Endo offices that accept Medicaid?    Copied from CRM [id number]. Topic: Referral - Status >> Feb 22, 2024  8:39 AM Revonda D wrote: Reason for CRM: Santana with [facility] is calling in regards to the endocrinology referral. Santana wanted to inform Dr.Tabori that they are unable to see the pt due to not accepting her insurance.

## 2024-03-18 NOTE — Telephone Encounter (Signed)
 The patient has [employer] Medicaid in addition to her [facility] insurance through her employer. She does not have standard [place] Medicaid. I have called [facility] and left a message explaining this and have resent the referral without [employer] attached.

## 2024-04-24 ENCOUNTER — Telehealth: Payer: Self-pay

## 2024-04-24 ENCOUNTER — Encounter: Payer: Self-pay | Admitting: Family Medicine

## 2024-04-24 NOTE — Telephone Encounter (Signed)
 They found her records. I am trying to see if we still need to fill out the paper since they obtained her vaccination records   Copied from CRM #[id number]. Topic: General - Other >> Apr 24, 2024 10:37 AM [name] wrote: Reason for CRM: Patient calling in and wanted to update Brenda Freeman that she figured out the tetnus situation and that she found it, and it was located under her maiden name, not her married name.

## 2024-04-24 NOTE — Telephone Encounter (Signed)
 I have printed form and placed in provider folder at your desk. I have filled out everything that I could. I sent another message earlier when patient called in the first time.

## 2024-04-24 NOTE — Telephone Encounter (Signed)
 Last tetanus shot was in 2019.   I called patient to let her know this has been completed. That she is not due until 09/2027. She said she needs the 3 series. I don't believe this is tetanus. She does not have a paper telling her exactly what she needs. I did tell her that Hep B and HPV are due. Maybe this is what they are talking about. She said no. She said college is requiring her to have 3 tetanus shots.... do you know what she could be talking about?    Copied from CRM [id number]. Topic: Clinical - Medication Question >> Apr 24, 2024  7:55 AM Brenda Freeman wrote: Reason for CRM: Patient would like for Dr. Charis nurse to give a call regarding tetanus shot. Would like to know if it's appropriate for her to get one with her school. Please call.

## 2024-04-24 NOTE — Telephone Encounter (Signed)
 Can we check [facility] and see if she has had other tetanus vaccines that just aren't documented in Epic?  She has 2 noted in Epic but I'm sure she has had the series- school probably just needs proof/documentation (if it truly is for tetanus)
